# Patient Record
Sex: Female | Born: 1950 | Race: White | Hispanic: No | Marital: Married
Health system: Southern US, Community
[De-identification: ages and names within clinical notes are randomized; demographics above are authoritative.]

---

## 2007-01-17 DIAGNOSIS — D239 Other benign neoplasm of skin, unspecified: Secondary | ICD-10-CM

## 2007-01-17 HISTORY — DX: Other benign neoplasm of skin, unspecified: D23.9

## 2007-10-15 ENCOUNTER — Ambulatory Visit: Payer: Self-pay | Admitting: Otolaryngology

## 2008-11-12 ENCOUNTER — Emergency Department: Payer: Self-pay | Admitting: Emergency Medicine

## 2009-01-14 IMAGING — US US THYROID
1 series · 17 of 25 positions shown · non-contrast
Comparison: none

REASON FOR EXAM: Left thyroid nodule
COMMENTS:

[Series 1: us thyroid · 17 of 50 slices shown]
[im 1/50]
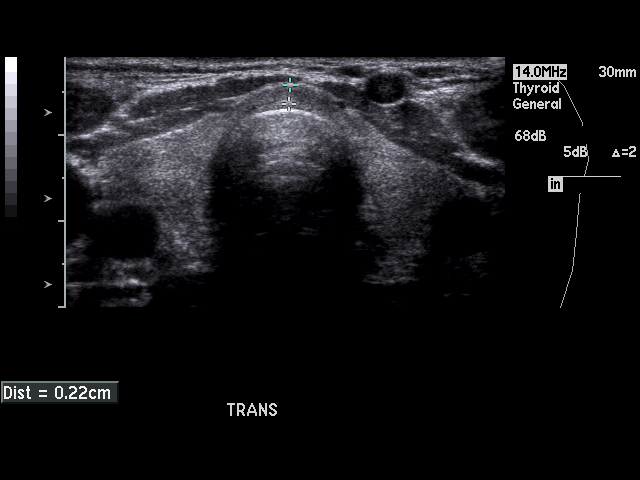
[im 5/50]
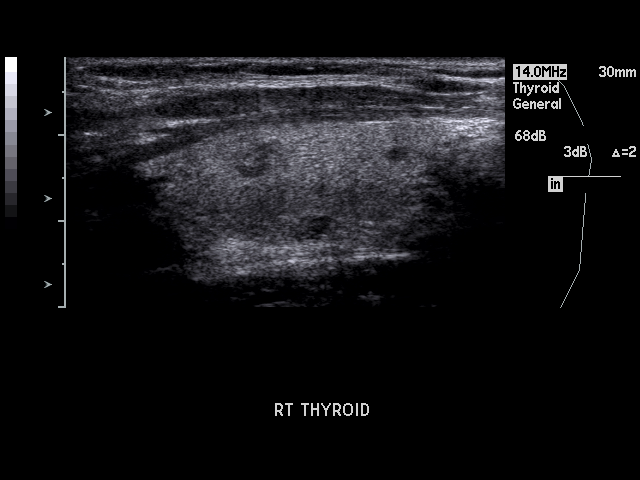
[im 7/50]
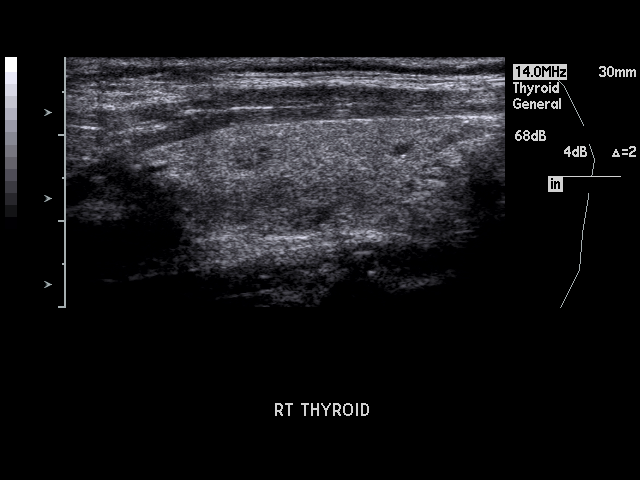
[im 11/50]
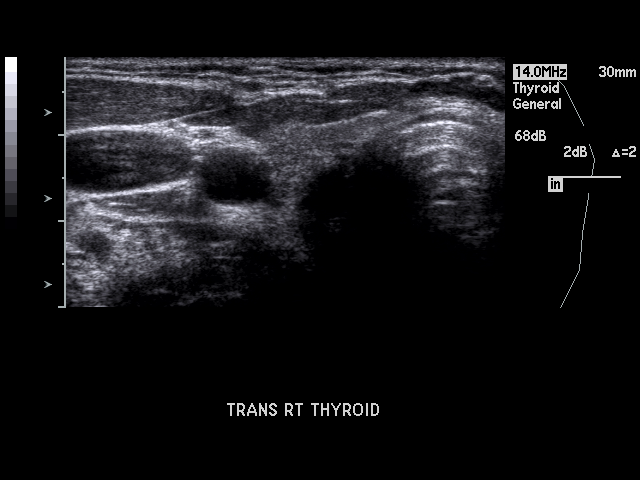
[im 13/50]
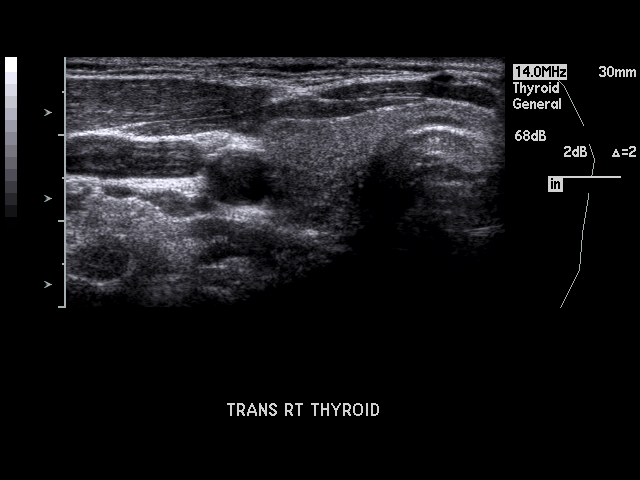
[im 17/50]
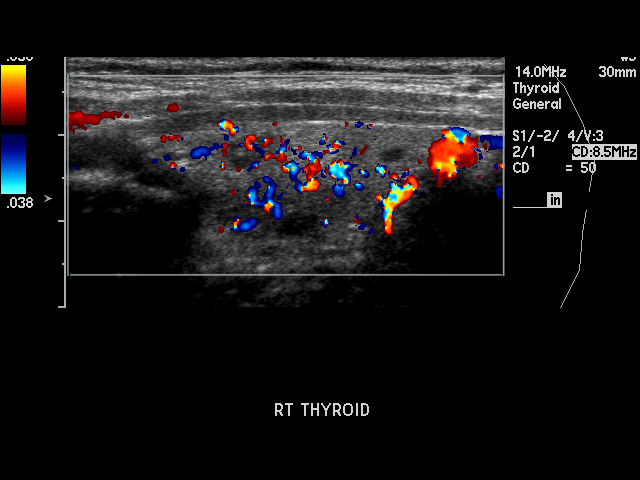
[im 19/50]
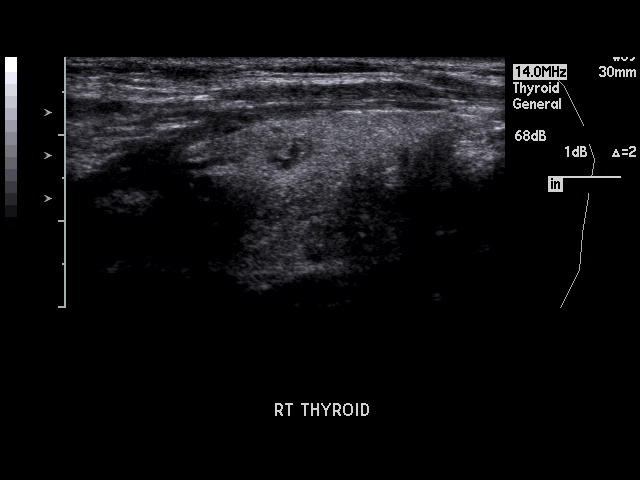
[im 23/50]
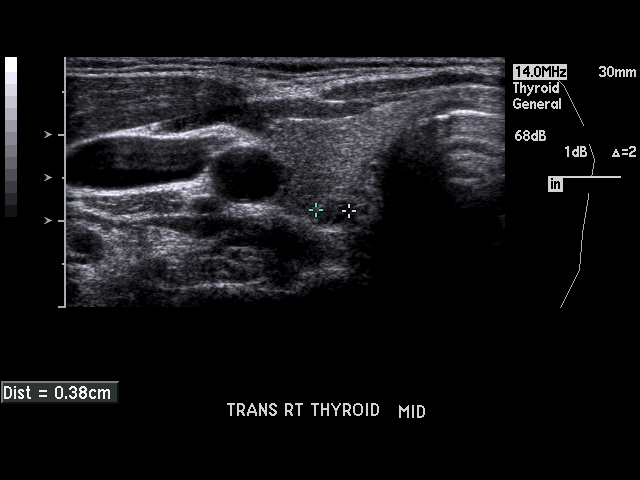
[im 25/50]
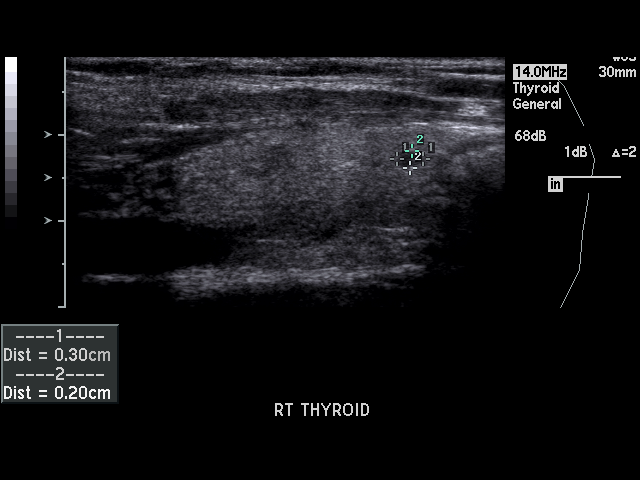
[im 27/50]
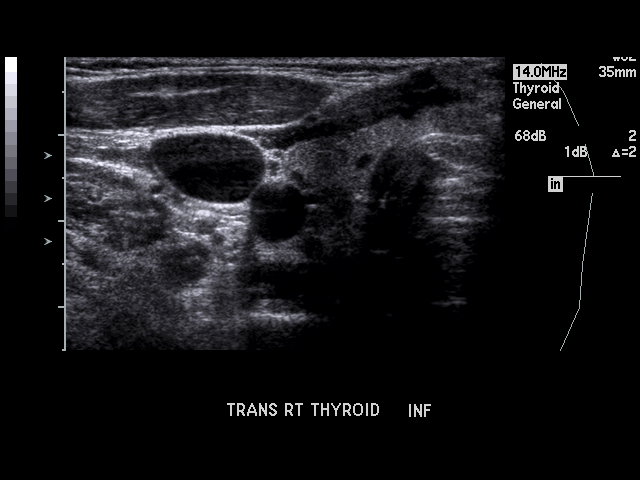
[im 31/50]
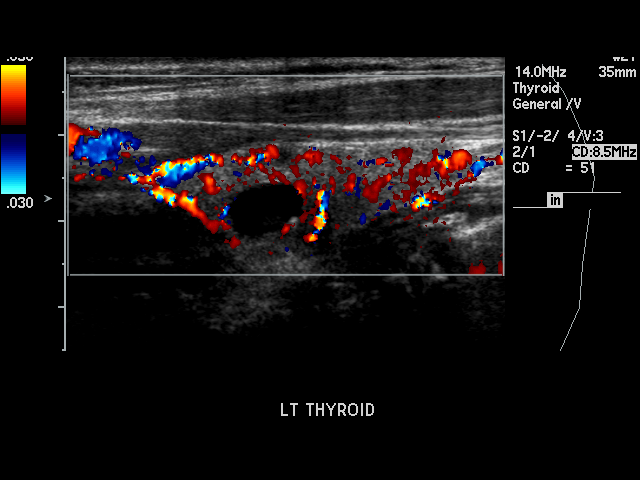
[im 33/50]
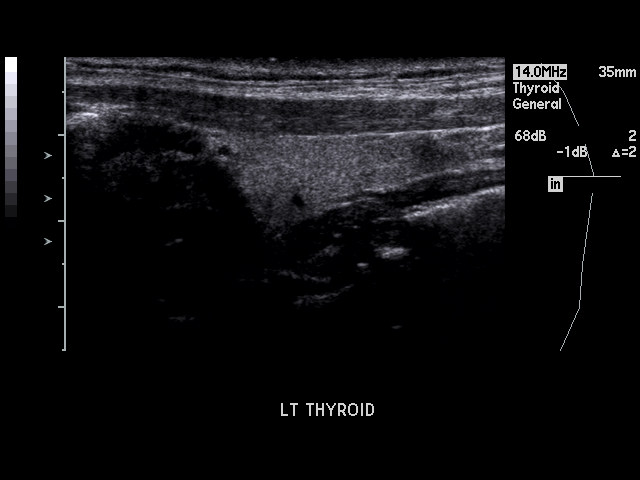
[im 37/50]
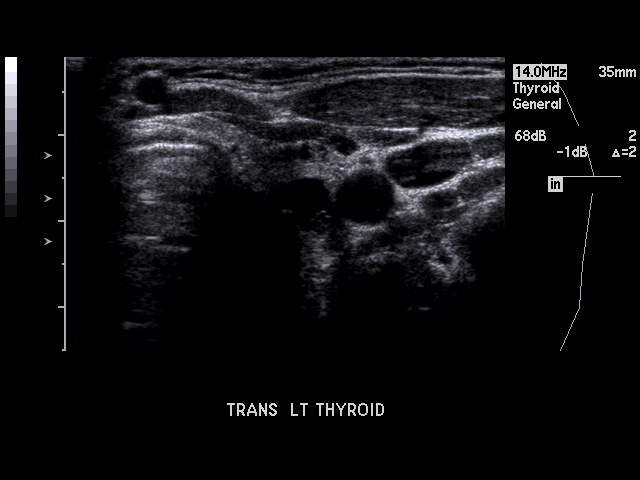
[im 39/50]
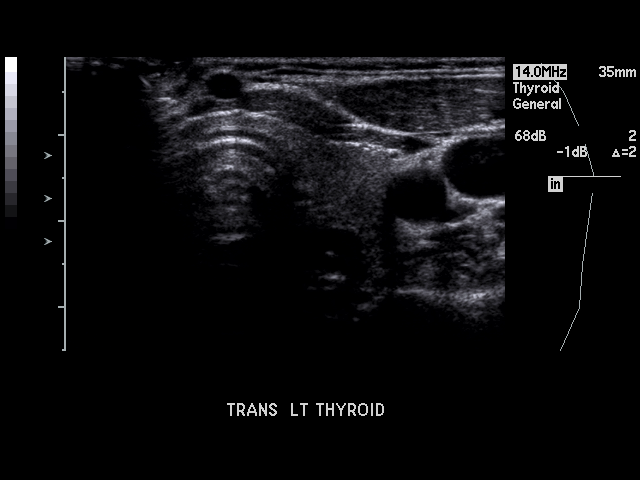
[im 43/50]
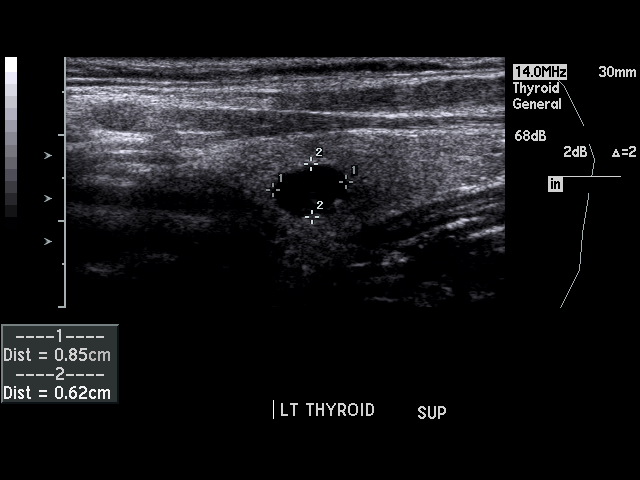
[im 45/50]
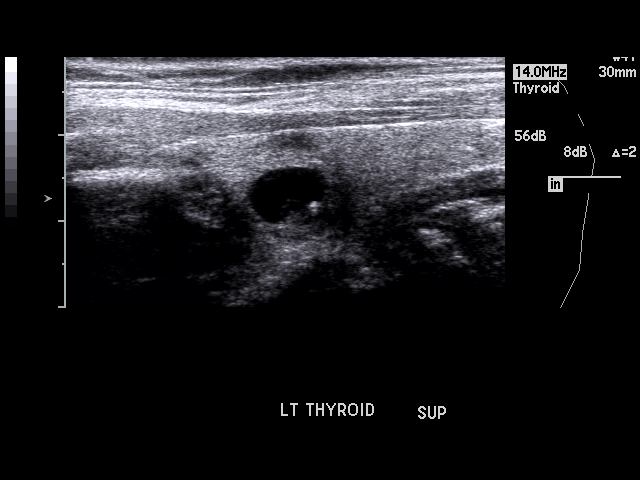
[im 50/50]
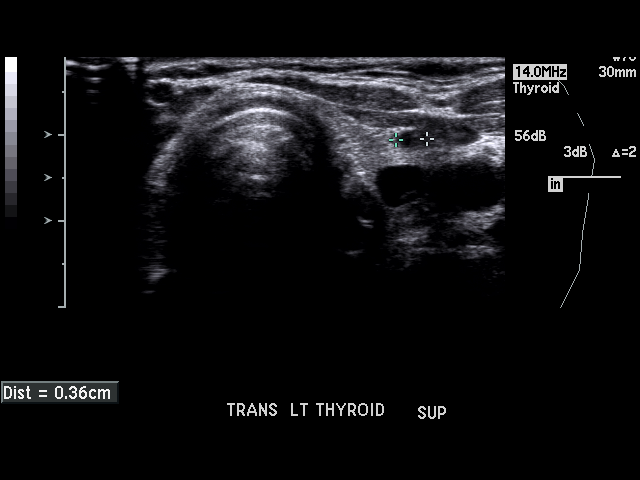

[17 of 25 positions shown; findings below may reference images not displayed]

PROCEDURE:     US  - US THYROID  - October 15, 2007  [DATE]

RESULT:     Thyroid Ultrasound reveals a normal size thyroid gland with the
RIGHT lobe measuring 3.7 cm and the LEFT lobe measuring 3.2 cm. The isthmus
measures 0.2 cm. A 6.0 mm, solid nodule is noted in the superior pole of the
RIGHT lobe of the thyroid. A 4.0 mm nodule is present in the mid portion of
the RIGHT lobe of the thyroid and a 3.0 mm nodule is noted inferiorly in the
RIGHT lobe of the thyroid. There is a 9.0 mm, complex cyst in the LEFT lobe
of the thyroid. There is a 5.0 mm nodule in the superior aspect of the LEFT
lobe of the thyroid. This nodule is solid.
IMPRESSION: 1.     Multinodular gland with multiple, 6.0 mm or less, solid nodules in
the RIGHT and LEFT lobes of the thyroid as described above.
2.     There is a 9.0 mm, complex cystic lesion in the LEFT lobe of the
thyroid. The lesion contains debris and the wall may be slightly thickened.
The wall may also contain subtle calcification. This is the nodule that is
to be aspirated/biopsied.

## 2010-02-12 IMAGING — CR RIGHT MIDDLE FINGER 2+V
1 series · 3 of 3 positions shown · non-contrast
Comparison: None

REASON FOR EXAM: caught in log splitter
COMMENTS:   LMP: Post-Menopausal

PROCEDURE:     DXR - DXR FINGER MID 3RD DIGIT RT HAND  - November 12, 2008  [DATE]
RESULT:     History: Trauma

[Series 1: view not recorded · 0.17mm/px · 3 of 3 slices shown]
[im 1/3]
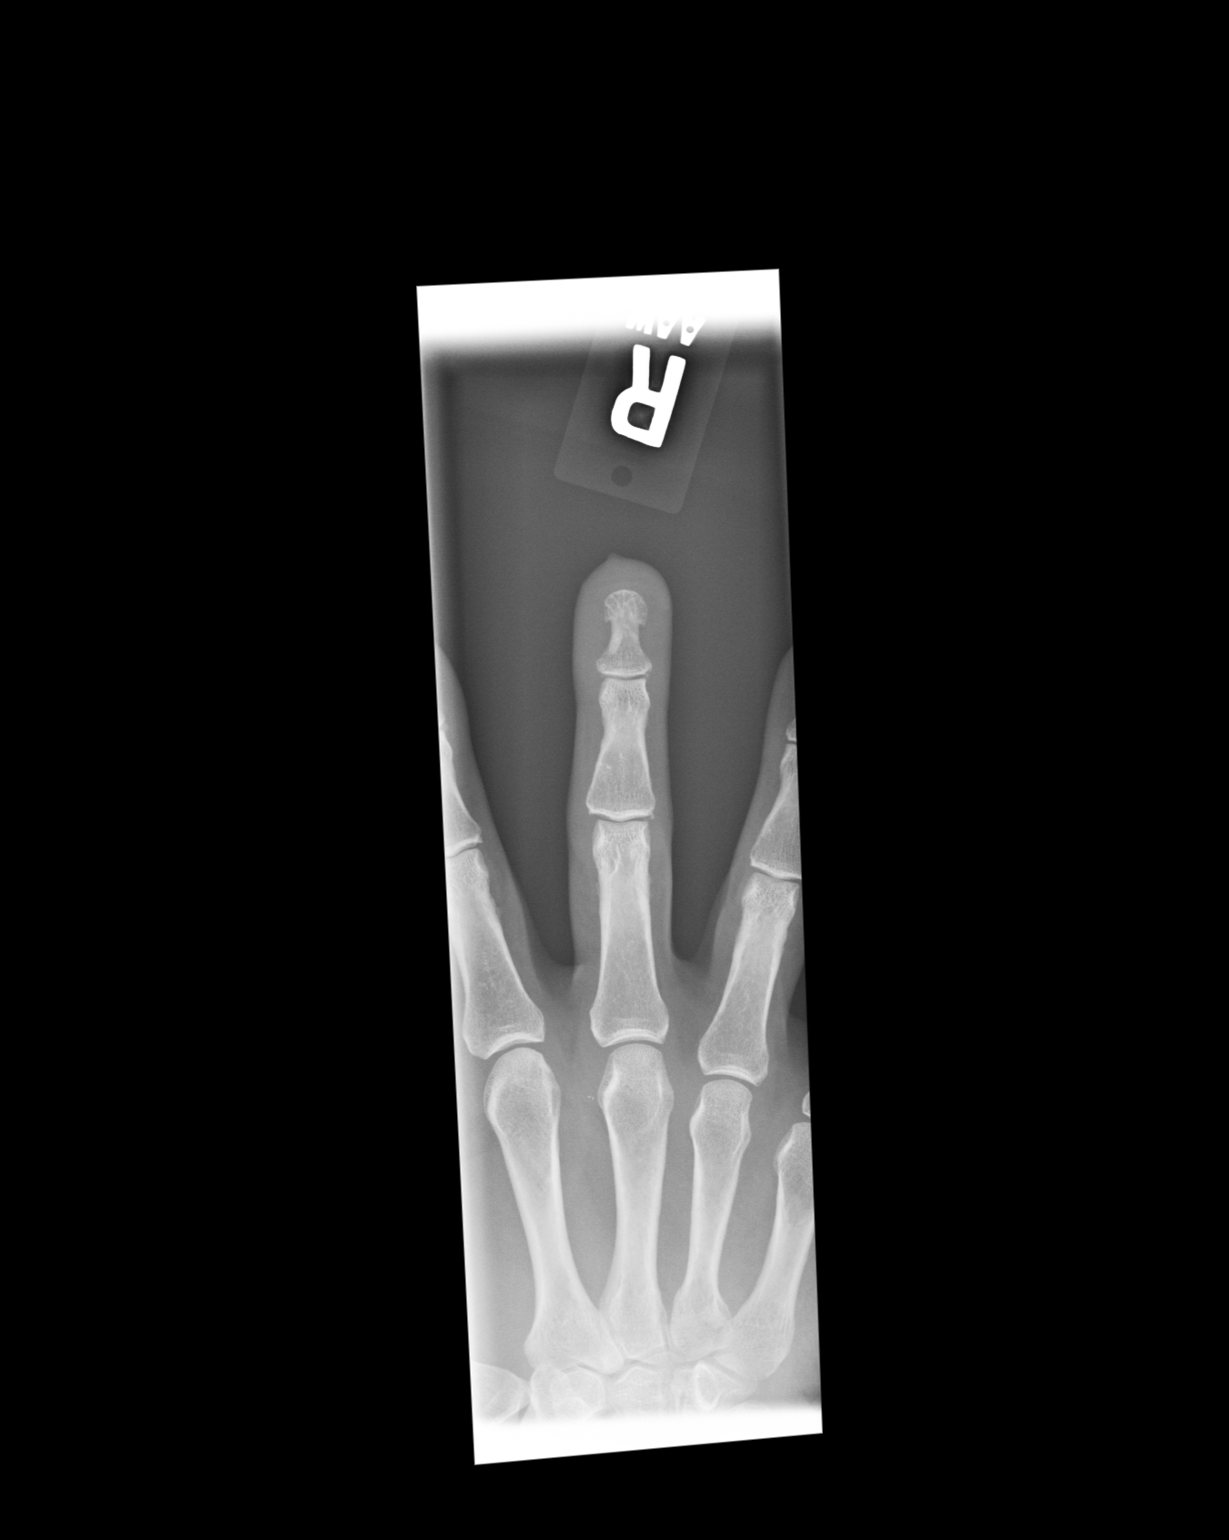
[im 2/3]
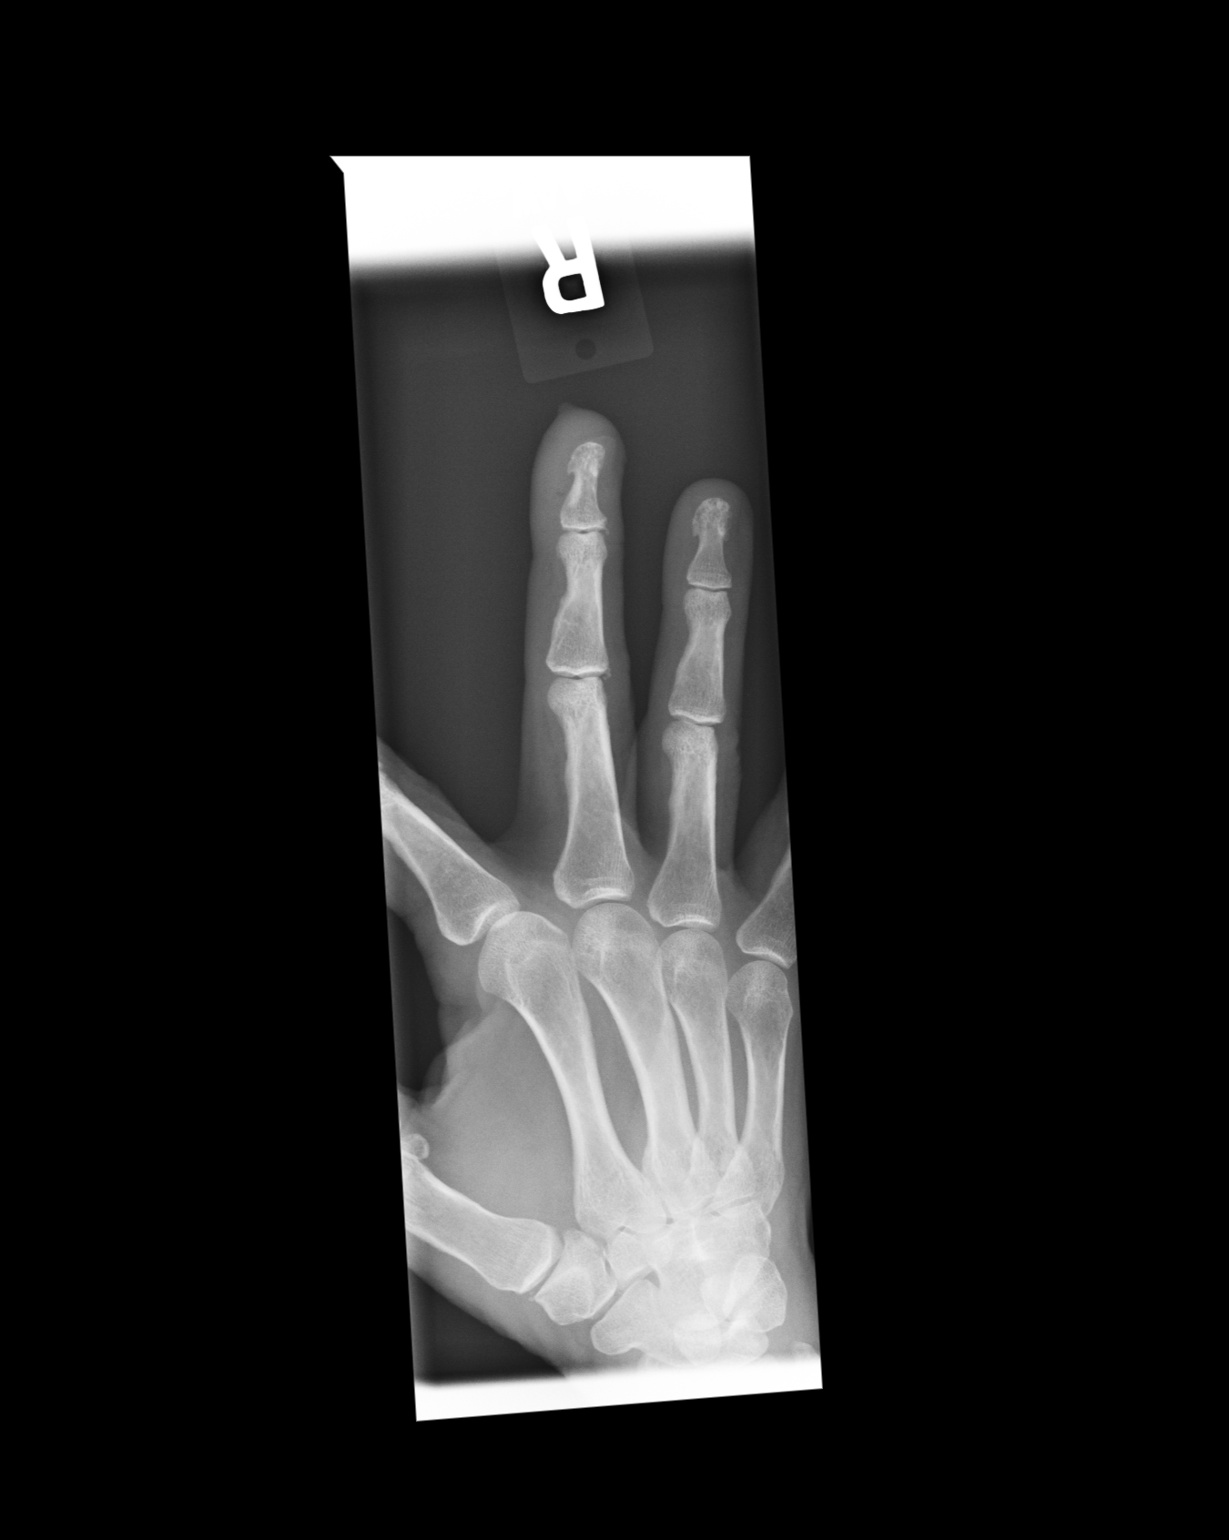
[im 3/3]
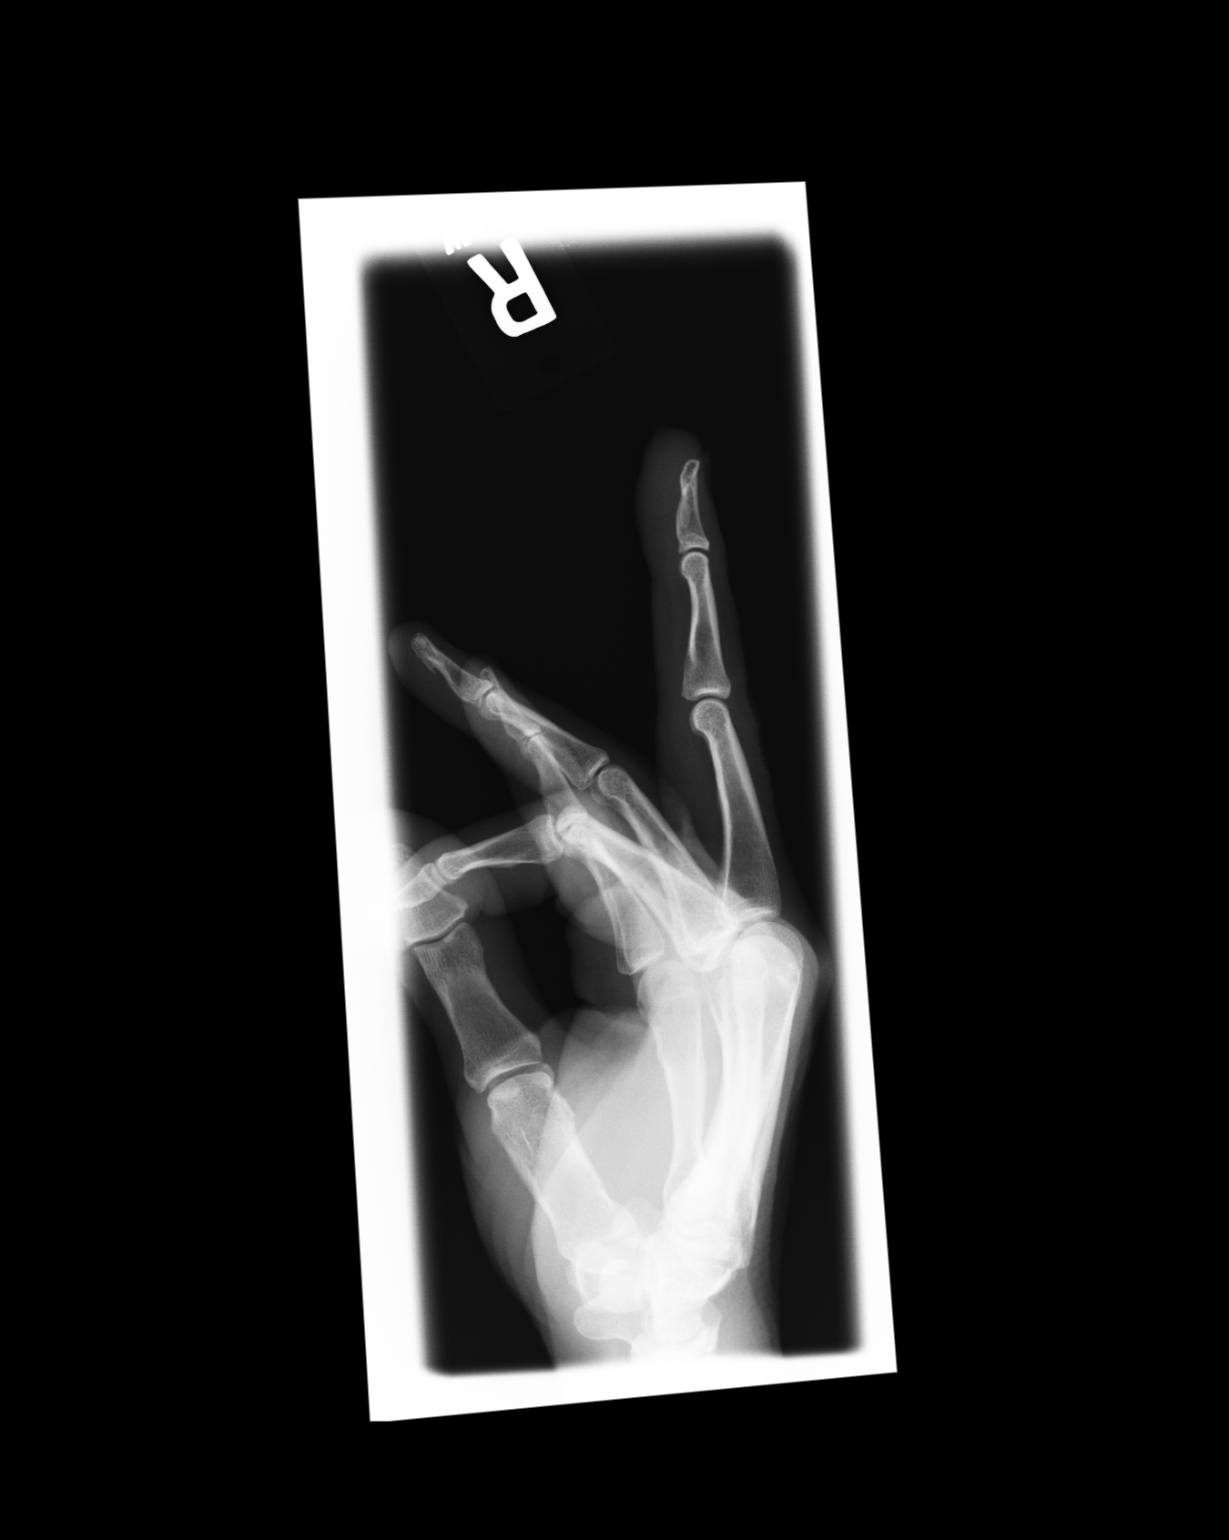

[3 of 3 positions shown; findings below may reference images not displayed]

FINDINGS: Three coned-down views of the left third digit demonstrates no fracture or
dislocation. There is subcutaneous emphysema in the soft tissues volar to
the distal third phalanx.
IMPRESSION: No acute osseous injury of the right third digit.

## 2022-03-13 ENCOUNTER — Ambulatory Visit: Payer: Medicare Other | Admitting: Dermatology

## 2022-03-13 DIAGNOSIS — D485 Neoplasm of uncertain behavior of skin: Secondary | ICD-10-CM

## 2022-03-13 DIAGNOSIS — D2271 Melanocytic nevi of right lower limb, including hip: Secondary | ICD-10-CM | POA: Diagnosis not present

## 2022-03-13 DIAGNOSIS — D229 Melanocytic nevi, unspecified: Secondary | ICD-10-CM

## 2022-03-13 DIAGNOSIS — D2262 Melanocytic nevi of left upper limb, including shoulder: Secondary | ICD-10-CM

## 2022-03-13 DIAGNOSIS — L578 Other skin changes due to chronic exposure to nonionizing radiation: Secondary | ICD-10-CM

## 2022-03-13 DIAGNOSIS — L821 Other seborrheic keratosis: Secondary | ICD-10-CM

## 2022-03-13 DIAGNOSIS — Z1283 Encounter for screening for malignant neoplasm of skin: Secondary | ICD-10-CM | POA: Diagnosis not present

## 2022-03-13 DIAGNOSIS — D1800 Hemangioma unspecified site: Secondary | ICD-10-CM

## 2022-03-13 DIAGNOSIS — L814 Other melanin hyperpigmentation: Secondary | ICD-10-CM

## 2022-03-13 NOTE — Patient Instructions (Addendum)
Wound Care Instructions  Cleanse wound gently with soap and water once a day then pat dry with clean gauze. Apply a thin coat of Petrolatum (petroleum jelly, "Vaseline") over the wound (unless you have an allergy to this). We recommend that you use a new, sterile tube of Vaseline. Do not pick or remove scabs. Do not remove the yellow or white "healing tissue" from the base of the wound.  Cover the wound with fresh, clean, nonstick gauze and secure with paper tape. You may use Band-Aids in place of gauze and tape if the wound is small enough, but would recommend trimming much of the tape off as there is often too much. Sometimes Band-Aids can irritate the skin.  You should call the office for your biopsy report after 1 week if you have not already been contacted.  If you experience any problems, such as abnormal amounts of bleeding, swelling, significant bruising, significant pain, or evidence of infection, please call the office immediately.  FOR ADULT SURGERY PATIENTS: If you need something for pain relief you may take 1 extra strength Tylenol (acetaminophen) AND 2 Ibuprofen (200mg each) together every 4 hours as needed for pain. (do not take these if you are allergic to them or if you have a reason you should not take them.) Typically, you may only need pain medication for 1 to 3 days.   Melanoma ABCDEs  Melanoma is the most dangerous type of skin cancer, and is the leading cause of death from skin disease.  You are more likely to develop melanoma if you: Have light-colored skin, light-colored eyes, or red or blond hair Spend a lot of time in the sun Tan regularly, either outdoors or in a tanning bed Have had blistering sunburns, especially during childhood Have a close family member who has had a melanoma Have atypical moles or large birthmarks  Early detection of melanoma is key since treatment is typically straightforward and cure rates are extremely high if we catch it early.   The  first sign of melanoma is often a change in a mole or a new dark spot.  The ABCDE system is a way of remembering the signs of melanoma.  A for asymmetry:  The two halves do not match. B for border:  The edges of the growth are irregular. C for color:  A mixture of colors are present instead of an even brown color. D for diameter:  Melanomas are usually (but not always) greater than 6mm - the size of a pencil eraser. E for evolution:  The spot keeps changing in size, shape, and color.  Please check your skin once per month between visits. You can use a small mirror in front and a large mirror behind you to keep an eye on the back side or your body.   If you see any new or changing lesions before your next follow-up, please call to schedule a visit.  Please continue daily skin protection including broad spectrum sunscreen SPF 30+ to sun-exposed areas, reapplying every 2 hours as needed when you're outdoors.    Due to recent changes in healthcare laws, you may see results of your pathology and/or laboratory studies on MyChart before the doctors have had a chance to review them. We understand that in some cases there may be results that are confusing or concerning to you. Please understand that not all results are received at the same time and often the doctors may need to interpret multiple results in order to provide you   with the best plan of care or course of treatment. Therefore, we ask that you please give us 2 business days to thoroughly review all your results before contacting the office for clarification. Should we see a critical lab result, you will be contacted sooner.   If You Need Anything After Your Visit  If you have any questions or concerns for your doctor, please call our main line at [phone] and press option 4 to reach your doctor's medical assistant. If no one answers, please leave a voicemail as directed and we will return your call as soon as possible. Messages left after 4  pm will be answered the following business day.   You may also send us a message via MyChart. We typically respond to MyChart messages within 1-2 business days.  For prescription refills, please ask your pharmacy to contact our office. Our fax number is [phone].  If you have an urgent issue when the clinic is closed that cannot wait until the next business day, you can page your doctor at the number below.    Please note that while we do our best to be available for urgent issues outside of office hours, we are not available 24/7.   If you have an urgent issue and are unable to reach us, you may choose to seek medical care at your doctor's office, retail clinic, urgent care center, or emergency room.  If you have a medical emergency, please immediately call 911 or go to the emergency department.  Pager Numbers  - Dr. Kowalski: [phone]  - Dr. Moye: [phone]  - Dr. Stewart: [phone]  In the event of inclement weather, please call our main line at [phone] for an update on the status of any delays or closures.  Dermatology Medication Tips: Please keep the boxes that topical medications come in in order to help keep track of the instructions about where and how to use these. Pharmacies typically print the medication instructions only on the boxes and not directly on the medication tubes.   If your medication is too expensive, please contact our office at [phone] option 4 or send us a message through MyChart.   We are unable to tell what your co-pay for medications will be in advance as this is different depending on your insurance coverage. However, we may be able to find a substitute medication at lower cost or fill out paperwork to get insurance to cover a needed medication.   If a prior authorization is required to get your medication covered by your insurance company, please allow us 1-2 business days to complete this process.  Drug prices often vary  depending on where the prescription is filled and some pharmacies may offer cheaper prices.  The website www.goodrx.com contains coupons for medications through different pharmacies. The prices here do not account for what the cost may be with help from insurance (it may be cheaper with your insurance), but the website can give you the price if you did not use any insurance.  - You can print the associated coupon and take it with your prescription to the pharmacy.  - You may also stop by our office during regular business hours and pick up a GoodRx coupon card.  - If you need your prescription sent electronically to a different pharmacy, notify our office through [facility] MyChart or by phone at [phone] option 4.     Si Usted Necesita Algo Despus de Su Visita  Tambin puede enviarnos un mensaje a travs   de MyChart. Por lo general respondemos a los mensajes de MyChart en el transcurso de 1 a 2 das hbiles.  Para renovar recetas, por favor pida a su farmacia que se ponga en contacto con nuestra oficina. Nuestro nmero de fax es el [phone].  Si tiene un asunto urgente cuando la clnica est cerrada y que no puede esperar hasta el siguiente da hbil, puede llamar/localizar a su doctor(a) al nmero que aparece a continuacin.   Por favor, tenga en cuenta que aunque hacemos todo lo posible para estar disponibles para asuntos urgentes fuera del horario de oficina, no estamos disponibles las 24 horas del da, los 7 das de la semana.   Si tiene un problema urgente y no puede comunicarse con nosotros, puede optar por buscar atencin mdica  en el consultorio de su doctor(a), en una clnica privada, en un centro de atencin urgente o en una sala de emergencias.  Si tiene una emergencia mdica, por favor llame inmediatamente al 911 o vaya a la sala de emergencias.  Nmeros de bper  - Dr. Kowalski: [phone]  - Dra. Moye: [phone]  - Dra. Stewart: [phone]  En caso de  inclemencias del tiempo, por favor llame a nuestra lnea principal al [phone] para una actualizacin sobre el estado de cualquier retraso o cierre.  Consejos para la medicacin en dermatologa: Por favor, guarde las cajas en las que vienen los medicamentos de uso tpico para ayudarle a seguir las instrucciones sobre dnde y cmo usarlos. Las farmacias generalmente imprimen las instrucciones del medicamento slo en las cajas y no directamente en los tubos del medicamento.   Si su medicamento es muy caro, por favor, pngase en contacto con nuestra oficina llamando al [phone] y presione la opcin 4 o envenos un mensaje a travs de MyChart.   No podemos decirle cul ser su copago por los medicamentos por adelantado ya que esto es diferente dependiendo de la cobertura de su seguro. Sin embargo, es posible que podamos encontrar un medicamento sustituto a menor costo o llenar un formulario para que el seguro cubra el medicamento que se considera necesario.   Si se requiere una autorizacin previa para que su compaa de seguros cubra su medicamento, por favor permtanos de 1 a 2 das hbiles para completar este proceso.  Los precios de los medicamentos varan con frecuencia dependiendo del lugar de dnde se surte la receta y alguna farmacias pueden ofrecer precios ms baratos.  El sitio web www.goodrx.com tiene cupones para medicamentos de diferentes farmacias. Los precios aqu no tienen en cuenta lo que podra costar con la ayuda del seguro (puede ser ms barato con su seguro), pero el sitio web puede darle el precio si no utiliz ningn seguro.  - Puede imprimir el cupn correspondiente y llevarlo con su receta a la farmacia.  - Tambin puede pasar por nuestra oficina durante el horario de atencin regular y recoger una tarjeta de cupones de GoodRx.  - Si necesita que su receta se enve electrnicamente a una farmacia diferente, informe a nuestra oficina a travs de MyChart de [place] o  por telfono llamando al [phone] y presione la opcin 4.  

## 2022-03-13 NOTE — Progress Notes (Signed)
New Patient Visit  Subjective  Dana Tran is a 71 y.o. female who presents for the following: Skin Problem (New patient here today to have some spots that have been itching at back checked. She has been using rx cortisone which has helped. Patient with a hx of dysplastic nevus. Patient advises she has had some spots treated in the past. ).   The following portions of the chart were reviewed this encounter and updated as appropriate:       Review of Systems:  No other skin or systemic complaints except as noted in HPI or Assessment and Plan.  Objective  Well appearing patient in no apparent distress; mood and affect are within normal limits.  A full examination was performed including scalp, head, eyes, ears, nose, lips, neck, chest, axillae, abdomen, back, buttocks, bilateral upper extremities, bilateral lower extremities, hands, feet, fingers, toes, fingernails, and toenails. All findings within normal limits unless otherwise noted below.  Spinal mid back 0.6 cm pink soft papule      left upper inner arm, right calf 4 x 3 mm medium brown macule at right calf 3 mm medium brown macule at left upper inner arm    Assessment & Plan  Neoplasm of uncertain behavior of skin Spinal mid back  Epidermal / dermal shaving  Lesion diameter (cm):  0.6 Informed consent: discussed and consent obtained   Patient was prepped and draped in usual sterile fashion: area prepped with alcohol. Anesthesia: the lesion was anesthetized in a standard fashion   Anesthetic:  1% lidocaine w/ epinephrine 1-100,000 buffered w/ 8.4% NaHCO3 Instrument used: flexible razor blade   Hemostasis achieved with: pressure, aluminum chloride and electrodesiccation   Outcome: patient tolerated procedure well   Post-procedure details: wound care instructions given   Post-procedure details comment:  Ointment and small bandage applied  Specimen 1 - Surgical pathology Differential Diagnosis: Irritated nevus  vs neurofibroma  Check Margins: No 0.6 cm pink soft papule    Nevus (2) left upper inner arm; right calf  Benign-appearing.  Observation.  Call clinic for new or changing lesions.  Recommend daily use of broad spectrum spf 30+ sunscreen to sun-exposed areas.    Lentigines - Scattered tan macules - Due to sun exposure - Benign-appearing, observe - Recommend daily broad spectrum sunscreen SPF 30+ to sun-exposed areas, reapply every 2 hours as needed. - Call for any changes  Seborrheic Keratoses - Stuck-on, waxy, tan-brown papules and/or plaques  - Benign-appearing - Discussed benign etiology and prognosis. - Observe - Call for any changes  Melanocytic Nevi - Tan-brown and/or pink-flesh-colored symmetric macules and papules - Benign appearing on exam today - Observation - Call clinic for new or changing moles - Recommend daily use of broad spectrum spf 30+ sunscreen to sun-exposed areas.   Hemangiomas - Red papules - Discussed benign nature - Observe - Call for any changes  Actinic Damage - Chronic condition, secondary to cumulative UV/sun exposure - diffuse scaly erythematous macules with underlying dyspigmentation - Recommend daily broad spectrum sunscreen SPF 30+ to sun-exposed areas, reapply every 2 hours as needed.  - Staying in the shade or wearing long sleeves, sun glasses (UVA+UVB protection) and wide brim hats (4-inch brim around the entire circumference of the hat) are also recommended for sun protection.  - Call for new or changing lesions.  Skin cancer screening performed today.  Return in about 1 year (around 03/14/2023) for TBSE.  Graciella Belton, RMA, am acting as scribe for Brendolyn Patty, MD .  Documentation: I have reviewed the above documentation for accuracy and completeness, and I agree with the above.  Brendolyn Patty MD

## 2022-03-19 ENCOUNTER — Telehealth: Payer: Self-pay

## 2022-03-19 NOTE — Telephone Encounter (Signed)
-----   Message from Brendolyn Patty, MD sent at 03/19/2022  9:31 AM EDT ----- Skin , spinal mid back NEUROFIBROMA, BASE INVOLVED  Benign - please call patient

## 2022-03-19 NOTE — Telephone Encounter (Signed)
Advised patient biopsy was benign and no further treatment needed.

## 2022-10-02 ENCOUNTER — Ambulatory Visit: Payer: Medicare Other | Admitting: Dermatology

## 2022-10-02 VITALS — BP 157/98 | HR 74

## 2022-10-02 DIAGNOSIS — L900 Lichen sclerosus et atrophicus: Secondary | ICD-10-CM

## 2022-10-02 DIAGNOSIS — D3617 Benign neoplasm of peripheral nerves and autonomic nervous system of trunk, unspecified: Secondary | ICD-10-CM

## 2022-10-02 DIAGNOSIS — D3610 Benign neoplasm of peripheral nerves and autonomic nervous system, unspecified: Secondary | ICD-10-CM

## 2022-10-02 DIAGNOSIS — H02403 Unspecified ptosis of bilateral eyelids: Secondary | ICD-10-CM | POA: Diagnosis not present

## 2022-10-02 MED ORDER — CLOBETASOL PROPIONATE 0.05 % EX CREA: TOPICAL_CREAM | CUTANEOUS | 0 refills | Status: DC

## 2022-10-02 NOTE — Progress Notes (Signed)
   Follow-Up Visit   Subjective  Dana Tran is a 72 y.o. female who presents for the following: Other (Having a issue with itching and burning in between vaginal and rectal area. patient has been dealing with since October 2022 prescribed a cortisone cream to use. Has been seen by several other doctors. Just in one area. ).  Uses HC 2.5% cream sparingly off and on, but doesn't seem to help.   The following portions of the chart were reviewed this encounter and updated as appropriate:      Review of Systems: No other skin or systemic complaints except as noted in HPI or Assessment and Plan.   Objective  Well appearing patient in no apparent distress; mood and affect are within normal limits.  A focused examination was performed including vaginal / perianal area, back, face. Relevant physical exam findings are noted in the Assessment and Plan.  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 Condition is bothersome/symptomatic for patient. Currently flared.   Lichen sclerosus is a chronic inflammatory condition of unknown cause that frequently involves the vaginal area and less commonly extragenital skin, and is NOT sexually transmitted. It frequently causes symptoms of pain and burning.  It requires regular monitoring and treatment with topical steroids to minimize inflammation and to reduce risk of scarring. There is also a risk of cancer in the vaginal area which is very low if inflammation is well controlled. Regular checks of the area are recommended. Please call if you notice any new or changing spots within this  area.  Start Clobetasol cream 0.05 % - apply thin layer topically to aa bid for 2 weeks. After 2 weeks use once daily to aa for itching/burning  Topical steroids (such as triamcinolone, fluocinolone, fluocinonide, mometasone, clobetasol, halobetasol, betamethasone, hydrocortisone) can cause thinning and lightening of the skin if they are used for too long in the same area. Your physician has selected the right strength medicine for your problem and area affected on the body. Please use your medication only as directed by your physician to prevent side effects.   Will recheck in 1 month  clobetasol cream (TEMOVATE) 0.05 % - vagina / perianal area Apply thin layer topically to aa bid for 2 weeks. After 2 weeks use once daily to aa for rash  Ptosis of both eyelids b/l eyes  Discussed blepharoplasty to treat  Recommend ocular eye surgeon Norlene Duel MD at [facility]  Or  Heron Nay MD. 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 Observe       Return for 1 month LS follow up.  I, Ruthell Rummage, CMA, am acting as scribe for Brendolyn Patty, MD.  Documentation: I have reviewed the above documentation for accuracy and completeness, and I agree with the above.  Brendolyn Patty MD

## 2022-10-02 NOTE — Patient Instructions (Addendum)
Recommend Dr. Norlene Duel at [facility] and [facility] For ocular plastic surgeon     Lichen sclerosus is a chronic inflammatory condition of unknown cause that frequently involves the vaginal area and less commonly extragenital skin, and is NOT sexually transmitted. It frequently causes symptoms of pain and burning.  It requires regular monitoring and treatment with topical steroids to minimize inflammation and to reduce risk of scarring. There is also a risk of cancer in the vaginal area which is very low if inflammation is well controlled. Regular checks of the area are recommended. Please call if you notice any new or changing spots within this area.  Start clobetasol cream - apply thin layer to affected areas of itchy rash twice daily for 2 weeks. If area is less itchy or inflamed can use once daily to affected areas at groin  Avoid applying to face and axilla. Use as directed. Long-term use can cause thinning of the skin.  Topical steroids (such as triamcinolone, fluocinolone, fluocinonide, mometasone, clobetasol, halobetasol, betamethasone, hydrocortisone) can cause thinning and lightening of the skin if they are used for too long in the same area. Your physician has selected the right strength medicine for your problem and area affected on the body. Please use your medication only as directed by your physician to prevent side effects.      Due to recent changes in healthcare laws, you may see results of your pathology and/or laboratory studies on MyChart before the doctors have had a chance to review them. We understand that in some cases there may be results that are confusing or concerning to you. Please understand that not all results are received at the same time and often the doctors may need to interpret multiple results in order to provide you with the best plan of care or course of treatment. Therefore, we ask that you please give [place] 2 business days to thoroughly review  all your results before contacting the office for clarification. Should we see a critical lab result, you will be contacted sooner.   If You Need Anything After Your Visit  If you have any questions or concerns for your doctor, please call our main line at [phone] and press option 4 to reach your doctor's medical assistant. If no one answers, please leave a voicemail as directed and we will return your call as soon as possible. Messages left after 4 pm will be answered the following business day.   You may also send [place] a message via [place]. We typically respond to MyChart messages within 1-2 business days.  For prescription refills, please ask your pharmacy to contact our office. Our fax number is [phone].  If you have an urgent issue when the clinic is closed that cannot wait until the next business day, you can page your doctor at the number below.    Please note that while we do our best to be available for urgent issues outside of office hours, we are not available 24/7.   If you have an urgent issue and are unable to reach [place], you may choose to seek medical care at your doctor's office, retail clinic, urgent care center, or emergency room.  If you have a medical emergency, please immediately call 911 or go to the emergency department.  Pager Numbers  - Dr. Nehemiah Massed: [phone]  - Dr. Laurence Ferrari: [phone]  - Dr. Nicole Kindred: [phone]  In the event of inclement weather, please call our main line at [phone] for an update  on the status of any delays or closures.  Dermatology Medication Tips: Please keep the boxes that topical medications come in in order to help keep track of the instructions about where and how to use these. Pharmacies typically print the medication instructions only on the boxes and not directly on the medication tubes.   If your medication is too expensive, please contact our office at [phone] option 4 or send [place] a message through [place].    We are unable to tell what your co-pay for medications will be in advance as this is different depending on your insurance coverage. However, we may be able to find a substitute medication at lower cost or fill out paperwork to get insurance to cover a needed medication.   If a prior authorization is required to get your medication covered by your insurance company, please allow [place] 1-2 business days to complete this process.  Drug prices often vary depending on where the prescription is filled and some pharmacies may offer cheaper prices.  The website www.goodrx.com contains coupons for medications through different pharmacies. The prices here do not account for what the cost may be with help from insurance (it may be cheaper with your insurance), but the website can give you the price if you did not use any insurance.  - You can print the associated coupon and take it with your prescription to the pharmacy.  - You may also stop by our office during regular business hours and pick up a GoodRx coupon card.  - If you need your prescription sent electronically to a different pharmacy, notify our office through [facility] or by phone at [phone] option 4.     Si Usted Necesita Algo Despus de Su Visita  Tambin puede enviarnos un mensaje a travs de Pharmacist, community. Por lo general respondemos a los mensajes de MyChart en el transcurso de 1 a 2 das hbiles.  Para renovar recetas, por favor pida a su farmacia que se ponga en contacto con nuestra oficina. Harland Dingwall de fax es [place] [phone].  Si tiene un asunto urgente cuando la clnica est cerrada y que no puede esperar hasta el siguiente da hbil, puede llamar/localizar a su doctor(a) al nmero que aparece a continuacin.   Por favor, tenga en cuenta que aunque hacemos todo lo posible para estar disponibles para asuntos urgentes fuera del horario de [place], no estamos disponibles las 24 horas del da, los 7 das de la [place].    Si tiene un problema urgente y no puede comunicarse con nosotros, puede optar por buscar atencin mdica  en el consultorio de su doctor(a), en una clnica privada, en un centro de atencin urgente o en una sala de emergencias.  Si tiene Engineering geologist, por favor llame inmediatamente al 911 o vaya a la sala de emergencias.  Nmeros de bper  - Dr. Nehemiah Massed: [phone]  - Dra. Moye: [phone]  - Dra. Nicole Kindred: [phone]  En caso de inclemencias del [place], por favor llame a Johnsie Kindred principal al [phone] para una actualizacin sobre el [place] de cualquier retraso o cierre.  Consejos para la medicacin en dermatologa: Por favor, guarde las cajas en las que vienen los medicamentos de uso tpico para ayudarle a seguir las instrucciones sobre dnde y cmo usarlos. Las farmacias generalmente imprimen las instrucciones del medicamento slo en las cajas y no directamente en los tubos del [place].   Si su medicamento es muy caro, por favor, pngase en contacto con nuestra oficina llamando al  [phone] y presione la opcin 4 o envenos un mensaje a travs de Pharmacist, community.   No podemos decirle cul ser su copago por los medicamentos por adelantado ya que esto es diferente dependiendo de la cobertura de su seguro. Sin embargo, es posible que podamos encontrar un medicamento sustituto a Electrical engineer un formulario para que el seguro cubra el medicamento que se considera necesario.   Si se requiere una autorizacin previa para que su compaa de seguros [place] su medicamento, por favor permtanos de 1 a 2 das hbiles para completar este proceso.  Los precios de los medicamentos varan con frecuencia dependiendo del Environmental consultant de dnde se surte la receta y alguna farmacias pueden ofrecer precios ms baratos.  El sitio web www.goodrx.com tiene cupones para medicamentos de Airline pilot. Los precios aqu no tienen en cuenta lo que podra costar con la ayuda del seguro  (puede ser ms barato con su seguro), pero el sitio web puede darle el precio si no utiliz Research scientist (physical sciences).  - Puede imprimir el cupn correspondiente y llevarlo con su receta a la farmacia.  - Tambin puede pasar por nuestra oficina durante el horario de atencin regular y Charity fundraiser una tarjeta de cupones de GoodRx.  - Si necesita que su receta se enve electrnicamente a una farmacia diferente, informe a nuestra oficina a travs de MyChart de [place] o por telfono llamando al [phone] y presione la opcin 4.

## 2022-10-03 MED ORDER — CLOBETASOL PROPIONATE 0.05 % EX OINT: TOPICAL_OINTMENT | CUTANEOUS | 0 refills | Status: DC

## 2022-10-03 NOTE — Addendum Note (Signed)
Addended by: Ruthell Rummage A on: 10/03/2022 07:56 AM   Modules accepted: Orders

## 2022-11-06 ENCOUNTER — Ambulatory Visit: Payer: Medicare Other | Admitting: Dermatology

## 2022-11-06 VITALS — BP 137/81 | HR 80

## 2022-11-06 DIAGNOSIS — H02843 Edema of right eye, unspecified eyelid: Secondary | ICD-10-CM | POA: Diagnosis not present

## 2022-11-06 DIAGNOSIS — L900 Lichen sclerosus et atrophicus: Secondary | ICD-10-CM | POA: Diagnosis not present

## 2022-11-06 DIAGNOSIS — L821 Other seborrheic keratosis: Secondary | ICD-10-CM

## 2022-11-06 DIAGNOSIS — L304 Erythema intertrigo: Secondary | ICD-10-CM | POA: Diagnosis not present

## 2022-11-06 DIAGNOSIS — L820 Inflamed seborrheic keratosis: Secondary | ICD-10-CM

## 2022-11-06 NOTE — Patient Instructions (Addendum)
Seborrheic Keratosis  What causes seborrheic keratoses? Seborrheic keratoses are harmless, common skin growths that first appear during adult life.  As time goes by, more growths appear.  Some people may develop a large number of them.  Seborrheic keratoses appear on both covered and uncovered body parts.  They are not caused by sunlight.  The tendency to develop seborrheic keratoses can be inherited.  They vary in color from skin-colored to gray, brown, or even black.  They can be either smooth or have a rough, warty surface.   Seborrheic keratoses are superficial and look as if they were stuck on the skin.  Under the microscope this type of keratosis looks like layers upon layers of skin.  That is why at times the top layer may seem to fall off, but the rest of the growth remains and re-grows.    Treatment Seborrheic keratoses do not need to be treated, but can easily be removed in the office.  Seborrheic keratoses often cause symptoms when they rub on clothing or jewelry.  Lesions can be in the way of shaving.  If they become inflamed, they can cause itching, soreness, or burning.  Removal of a seborrheic keratosis can be accomplished by freezing, burning, or surgery. If any spot bleeds, scabs, or grows rapidly, please return to have it checked, as these can be an indication of a skin cancer.  Cryotherapy Aftercare  Wash gently with soap and water everyday.   Apply Vaseline and Band-Aid daily until healed.      For areas under breasts   Recommend OTC Zeasorb AF powder to body folds daily after shower.  It is often found in the athlete's foot section in the pharmacy.  Avoid using powders that contain cornstarch.  Recommend OTC clotrimazole or miconazole cream for yeast infection of skin under breast  Intertrigo Intertrigo is skin irritation (inflammation) that happens in warm, moist areas of the body. The irritation can cause a rash and make skin raw and itchy. The rash is usually pink or  red. It happens mostly between folds of skin or where skin rubs together, such as: In the armpits. Under the breasts. Under the belly. In the groin area. Around the butt area. Between the toes. This condition is not passed from person to person. What are the causes? Heat, moisture, rubbing, and not enough air movement. The condition can be made worse by: Sweat. Bacteria. A fungus, such as yeast. What increases the risk? Moisture in your skin folds. You are more likely to develop this condition if you: Are not able to move around. Live in a warm and moist climate. Are not able to control your pee (urine) or poop (stool). Wear splints, braces, or other medical devices. Are overweight. Have diabetes. What are the signs or symptoms? A pink or red skin rash in a skin fold or near a skin fold. Raw or scaly skin. Itching. A burning feeling. Bleeding. Leaking fluid. A bad smell. How is this treated? Cleaning and drying your skin. Taking an antibiotic medicine or using an antibiotic skin cream for a bacterial infection. Using an antifungal cream on your skin or taking pills for an infection that was caused by a fungus, such as yeast. Using a steroid ointment to stop the itching and irritation. Separating the skin fold with a clean cotton cloth to absorb moisture and allow air to flow into the area. Follow these instructions at home: Keep the affected area clean and dry. Do not scratch your skin. Stay  cool as much as you can. Use an air conditioner or a fan, if you have one. Apply over-the-counter and prescription medicines only as told by your doctor. If you were prescribed antibiotics, use them as told by your doctor. Do not stop using the antibiotic even if you start to feel better. Keep all follow-up visits. Your doctor may need to check your skin to make sure that the treatment is working. How is this prevented? Shower and dry yourself well after being active. Use a hair dryer  on a cool setting to dry between skin folds. Do not wear tight clothes. Wear clothes that: Are loose. Take moisture away from your body. Are made of cotton. Wear a bra that gives good support, if needed. Protect the skin in your groin and butt area as told by your doctor. To do this: Follow a regular cleaning routine. Use creams, powders, or ointments that protect your skin. Change protection pads often. Stay at a healthy weight. Take care of your feet. This is very important if you have diabetes. You should: Wear shoes that fit well. Keep your feet dry. Wear clean cotton or wool socks. Keep your blood sugar under control if you have diabetes. Contact a doctor if: Your symptoms do not get better with treatment. Your symptoms get worse or they spread. You notice more redness and warmth. You have a fever. This information is not intended to replace advice given to you by your health care provider. Make sure you discuss any questions you have with your health care provider. Document Revised: 12/14/2021 Document Reviewed: 12/14/2021 Elsevier Patient Education  [place].      Lichen sclerosus is a chronic inflammatory condition of unknown cause that frequently involves the vaginal area and less commonly extragenital skin, and is NOT sexually transmitted. It frequently causes symptoms of pain and burning.  It requires regular monitoring and treatment with topical steroids to minimize inflammation and to reduce risk of scarring. There is also a risk of cancer in the vaginal area which is very low if inflammation is well controlled. Regular checks of the area are recommended. Please call if you notice any new or changing spots within this area.   Use 1 - 2 times weekly  Clobetasol ointment 0.05 % - apply thin layer topically to aa bid for 2 weeks. After 2 weeks use once daily to aa for itching/burning  Topical steroids (such as triamcinolone, fluocinolone, fluocinonide, mometasone,  clobetasol, halobetasol, betamethasone, hydrocortisone) can cause thinning and lightening of the skin if they are used for too long in the same area. Your physician has selected the right strength medicine for your problem and area affected on the body. Please use your medication only as directed by your physician to prevent side effects.      Due to recent changes in healthcare laws, you may see results of your pathology and/or laboratory studies on MyChart before the doctors have had a chance to review them. We understand that in some cases there may be results that are confusing or concerning to you. Please understand that not all results are received at the same time and often the doctors may need to interpret multiple results in order to provide you with the best plan of care or course of treatment. Therefore, we ask that you please give [place] 2 business days to thoroughly review all your results before contacting the office for clarification. Should we see a critical lab result, you will be contacted sooner.  If You Need Anything After Your Visit  If you have any questions or concerns for your doctor, please call our main line at [phone] and press option 4 to reach your doctor's medical assistant. If no one answers, please leave a voicemail as directed and we will return your call as soon as possible. Messages left after 4 pm will be answered the following business day.   You may also send [place] a message via [place]. We typically respond to MyChart messages within 1-2 business days.  For prescription refills, please ask your pharmacy to contact our office. Our fax number is [phone].  If you have an urgent issue when the clinic is closed that cannot wait until the next business day, you can page your doctor at the number below.    Please note that while we do our best to be available for urgent issues outside of office hours, we are not available 24/7.   If you have an urgent issue and  are unable to reach [place], you may choose to seek medical care at your doctor's office, retail clinic, urgent care center, or emergency room.  If you have a medical emergency, please immediately call 911 or go to the emergency department.  Pager Numbers  - Dr. Nehemiah Massed: [phone]  - Dr. Laurence Ferrari: [phone]  - Dr. Nicole Kindred: [phone]  In the event of inclement weather, please call our main line at [phone] for an update on the status of any delays or closures.  Dermatology Medication Tips: Please keep the boxes that topical medications come in in order to help keep track of the instructions about where and how to use these. Pharmacies typically print the medication instructions only on the boxes and not directly on the medication tubes.   If your medication is too expensive, please contact our office at [phone] option 4 or send [place] a message through [place].   We are unable to tell what your co-pay for medications will be in advance as this is different depending on your insurance coverage. However, we may be able to find a substitute medication at lower cost or fill out paperwork to get insurance to cover a needed medication.   If a prior authorization is required to get your medication covered by your insurance company, please allow [place] 1-2 business days to complete this process.  Drug prices often vary depending on where the prescription is filled and some pharmacies may offer cheaper prices.  The website www.goodrx.com contains coupons for medications through different pharmacies. The prices here do not account for what the cost may be with help from insurance (it may be cheaper with your insurance), but the website can give you the price if you did not use any insurance.  - You can print the associated coupon and take it with your prescription to the pharmacy.  - You may also stop by our office during regular business hours and pick up a GoodRx coupon card.  - If you need your  prescription sent electronically to a different pharmacy, notify our office through [facility] or by phone at [phone] option 4.     Si Usted Necesita Algo Despus de Su Visita  Tambin puede enviarnos un mensaje a travs de Pharmacist, community. Por lo general respondemos a los mensajes de MyChart en el transcurso de 1 a 2 das hbiles.  Para renovar recetas, por favor pida a su farmacia que se ponga en contacto con nuestra oficina. Harland Dingwall de fax es [place] [phone].  Si  tiene un asunto urgente cuando la clnica est cerrada y que no puede esperar hasta el siguiente da hbil, puede llamar/localizar a su doctor(a) al nmero que aparece a continuacin.   Por favor, tenga en cuenta que aunque hacemos todo lo posible para estar disponibles para asuntos urgentes fuera del horario de [place], no estamos disponibles las 24 horas del da, los 7 das de la [place].   Si tiene un problema urgente y no puede comunicarse con nosotros, puede optar por buscar atencin mdica  en el consultorio de su doctor(a), en una clnica privada, en un centro de atencin urgente o en una sala de emergencias.  Si tiene Engineering geologist, por favor llame inmediatamente al 911 o vaya a la sala de emergencias.  Nmeros de bper  - Dr. Nehemiah Massed: [phone]  - Dra. Moye: [phone]  - Dra. Nicole Kindred: [phone]  En caso de inclemencias del [place], por favor llame a Johnsie Kindred principal al [phone] para una actualizacin sobre el [place] de cualquier retraso o cierre.  Consejos para la medicacin en dermatologa: Por favor, guarde las cajas en las que vienen los medicamentos de uso tpico para ayudarle a seguir las instrucciones sobre dnde y cmo usarlos. Las farmacias generalmente imprimen las instrucciones del medicamento slo en las cajas y no directamente en los tubos del [place].   Si su medicamento es muy caro, por favor, pngase en contacto con Zigmund Daniel llamando al [phone]  y presione la opcin 4 o envenos un mensaje a travs de Pharmacist, community.   No podemos decirle cul ser su copago por los medicamentos por adelantado ya que esto es diferente dependiendo de la cobertura de su seguro. Sin embargo, es posible que podamos encontrar un medicamento sustituto a Electrical engineer un formulario para que el seguro cubra el medicamento que se considera necesario.   Si se requiere una autorizacin previa para que su compaa de seguros [place] su medicamento, por favor permtanos de 1 a 2 das hbiles para completar este proceso.  Los precios de los medicamentos varan con frecuencia dependiendo del Environmental consultant de dnde se surte la receta y alguna farmacias pueden ofrecer precios ms baratos.  El sitio web www.goodrx.com tiene cupones para medicamentos de Airline pilot. Los precios aqu no tienen en cuenta lo que podra costar con la ayuda del seguro (puede ser ms barato con su seguro), pero el sitio web puede darle el precio si no utiliz Research scientist (physical sciences).  - Puede imprimir el cupn correspondiente y llevarlo con su receta a la farmacia.  - Tambin puede pasar por nuestra oficina durante el horario de atencin regular y Charity fundraiser una tarjeta de cupones de GoodRx.  - Si necesita que su receta se enve electrnicamente a una farmacia diferente, informe a nuestra oficina a travs de MyChart de [place] o por telfono llamando al [phone] y presione la opcin 4.

## 2022-11-06 NOTE — Progress Notes (Signed)
Follow-Up Visit   Subjective  Dana Tran is a 72 y.o. 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 Reports symptoms have improved while using clobetasol cream. Didn't get ointment, but cream works fine. Patient also concerned with spot at right clavicle area, rash under breasts, and irritated spot under breasts.   The following portions of the chart were reviewed this encounter and updated as appropriate: medications, allergies, medical history  Review of Systems:  No other skin or systemic complaints except as noted in HPI or Assessment and Plan.  Objective  Well appearing patient in no apparent distress; mood and affect are within normal limits.   A focused examination was performed of the following areas: Vaginal and perianal area, right orbital area, right clavicle , b/l inframammary  Relevant exam findings are noted in the Assessment and Plan.    Assessment & Plan   INFLAMED SEBORRHEIC KERATOSIS Exam: Erythematous keratotic or waxy stuck-on papule  Symptomatic, irritating, patient would like treated.  Benign-appearing.  Call clinic for new or changing lesions.   Prior to procedure, discussed risks of blister formation, small wound, skin dyspigmentation, or rare scar following treatment. Recommend Vaseline ointment to treated areas while healing.  Destruction Procedure Note Destruction method: cryotherapy   Informed consent: discussed and consent obtained   Lesion destroyed using liquid nitrogen: Yes   Outcome: patient tolerated procedure well with no complications   Post-procedure details: wound care instructions given   Locations: right clavicle x 1 , left inframammary x 4 # of Lesions Treated: 5    Lichen sclerosus vagina / perianal area   Mild hypopigmentation and erythema at inferior vaginal introitus and mild erythema at BL labia majora  Chronic and persistent condition with duration  or expected duration over one year. Much improved with decreased symptoms but not currently at goal.     Lichen sclerosus is a chronic inflammatory condition of unknown cause that frequently involves the vaginal area and less commonly extragenital skin, and is NOT sexually transmitted. It frequently causes symptoms of pain and burning.  It requires regular monitoring and treatment with topical steroids to minimize inflammation and to reduce risk of scarring. There is also a risk of cancer in the vaginal area which is very low if inflammation is well controlled. Regular checks of the area are recommended. Please call if you notice any new or changing spots within this area.   Clobetasol ointment 0.05 % - Continue 1 or 2 times weekly as maintenance. For flares- apply thin layer topically to aa vaginal area bid for 2 weeks, then after 2 weeks use once daily to aa for itching/burning   Topical steroids (such as triamcinolone, fluocinolone, fluocinonide, mometasone, clobetasol, halobetasol, betamethasone, hydrocortisone) can cause thinning and lightening of the skin if they are used for too long in the same area. Your physician has selected the right strength medicine for your problem and area affected on the body. Please use your medication only as directed by your physician to prevent side effects.   Swelling  Exam: swelling at right periorbital area   Treatment Plan: Hx of angioedema treated with prednisone and antihistamines  Discussed seeing a allergy specialist or angioedema specialist in [place]. Pt will consider   SEBORRHEIC KERATOSIS - Stuck-on, waxy, tan-brown papules and/or plaques  - Benign-appearing - Discussed benign etiology and prognosis. - Observe - Call for any changes  INTERTRIGO Exam Mild erythema inframammary  Chronic and persistent  condition with duration or expected duration over one year. Condition is symptomatic/ bothersome to patient. Not currently at goal.    Intertrigo is a chronic recurrent rash that occurs in skin fold areas that may be associated with friction; heat; moisture; yeast; fungus; and bacteria.  It is exacerbated by increased movement / activity; sweating; and higher atmospheric temperature.  Treatment Plan Recommend OTC Zeasorb AF powder to body folds daily after shower.  It is often found in the athlete's foot section in the pharmacy.  Avoid using powders that contain cornstarch.  Recommend OTC clotrimazole or miconazole cream qd/bid for rash under breast   Return for keep follow up as scheduled .  I, Ruthell Rummage, CMA, am acting as scribe for Brendolyn Patty, MD.   Documentation: I have reviewed the above documentation for accuracy and completeness, and I agree with the above.  Brendolyn Patty, MD

## 2023-03-19 ENCOUNTER — Ambulatory Visit: Payer: Medicare Other | Admitting: Dermatology

## 2023-03-19 VITALS — BP 142/82 | HR 82

## 2023-03-19 DIAGNOSIS — D225 Melanocytic nevi of trunk: Secondary | ICD-10-CM | POA: Diagnosis not present

## 2023-03-19 DIAGNOSIS — R202 Paresthesia of skin: Secondary | ICD-10-CM

## 2023-03-19 DIAGNOSIS — L905 Scar conditions and fibrosis of skin: Secondary | ICD-10-CM | POA: Diagnosis not present

## 2023-03-19 DIAGNOSIS — S0096XA Insect bite (nonvenomous) of unspecified part of head, initial encounter: Secondary | ICD-10-CM

## 2023-03-19 DIAGNOSIS — L821 Other seborrheic keratosis: Secondary | ICD-10-CM

## 2023-03-19 DIAGNOSIS — S90562A Insect bite (nonvenomous), left ankle, initial encounter: Secondary | ICD-10-CM

## 2023-03-19 DIAGNOSIS — D485 Neoplasm of uncertain behavior of skin: Secondary | ICD-10-CM

## 2023-03-19 DIAGNOSIS — L900 Lichen sclerosus et atrophicus: Secondary | ICD-10-CM

## 2023-03-19 DIAGNOSIS — W908XXA Exposure to other nonionizing radiation, initial encounter: Secondary | ICD-10-CM

## 2023-03-19 DIAGNOSIS — D2262 Melanocytic nevi of left upper limb, including shoulder: Secondary | ICD-10-CM

## 2023-03-19 DIAGNOSIS — Z1283 Encounter for screening for malignant neoplasm of skin: Secondary | ICD-10-CM

## 2023-03-19 DIAGNOSIS — L578 Other skin changes due to chronic exposure to nonionizing radiation: Secondary | ICD-10-CM

## 2023-03-19 DIAGNOSIS — D2271 Melanocytic nevi of right lower limb, including hip: Secondary | ICD-10-CM

## 2023-03-19 DIAGNOSIS — S20419A Abrasion of unspecified back wall of thorax, initial encounter: Secondary | ICD-10-CM

## 2023-03-19 DIAGNOSIS — W57XXXA Bitten or stung by nonvenomous insect and other nonvenomous arthropods, initial encounter: Secondary | ICD-10-CM

## 2023-03-19 DIAGNOSIS — D229 Melanocytic nevi, unspecified: Secondary | ICD-10-CM

## 2023-03-19 DIAGNOSIS — S90561A Insect bite (nonvenomous), right ankle, initial encounter: Secondary | ICD-10-CM

## 2023-03-19 DIAGNOSIS — L814 Other melanin hyperpigmentation: Secondary | ICD-10-CM

## 2023-03-19 NOTE — Patient Instructions (Addendum)
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 Chronic rubbing or scratching causes darkening of the skin.  OTC treatments which can help with itch include numbing creams like pramoxine or lidocaine which temporarily reduce itch or Capsaicin-containing creams which cause a burning sensation but which sometimes over time will reset the nerves to stop producing itch.  If you choose to use Capsaicin cream, it is recommended to use it 5 times daily for 1 week followed by 3 times daily for 3-6 weeks. You may have to continue using it long-term.  If not doing well with OTC options, could consider Skin Medicinals compounded prescription anti-itch cream with Amitriptyline 5% / Lidocaine 5% / Pramoxine 1% or Amitriptyline 5% / Gabapentin 10% / Lidocaine 5% Cream or other prescription cream or pill options.    Wound Care Instructions  Cleanse wound gently with soap and water once a day then pat dry with clean gauze. Apply a thin coat of Petrolatum (petroleum jelly, "Vaseline") over the wound (unless you have an allergy to this). We recommend that you use a new, sterile tube of Vaseline. Do not pick or remove scabs. Do not remove the yellow or white "healing tissue" from the base of the wound.  Cover the wound with fresh, clean, nonstick gauze and secure with paper tape. You may use Band-Aids in place of gauze and tape if the wound is small enough, but would recommend trimming much of the tape off as there is often too much. Sometimes Band-Aids can irritate the skin.  You should call the office for your biopsy report after 1 week if you have not already been contacted.  If you experience any problems, such as abnormal amounts of bleeding, swelling, significant bruising, significant pain, or evidence of infection, please call the office immediately.  FOR ADULT SURGERY PATIENTS: If you need something for pain  relief you may take 1 extra strength Tylenol (acetaminophen) AND 2 Ibuprofen (200mg  each) together every 4 hours as needed for pain. (do not take these if you are allergic to them or if you have a reason you should not take them.) Typically, you may only need pain medication for 1 to 3 days.    Due to recent changes in healthcare laws, you may see results of your pathology and/or laboratory studies on MyChart before the doctors have had a chance to review them. We understand that in some cases there may be results that are confusing or concerning to you. Please understand that not all results are received at the same time and often the doctors may need to interpret multiple results in order to provide you with the best plan of care or course of treatment. Therefore, we ask that you please give [place] 2 business days to thoroughly review all your results before contacting the office for clarification. Should we see a critical lab result, you will be contacted sooner.   If You Need Anything After Your Visit  If you have any questions or concerns for your doctor, please call our main line at [phone] and press option 4 to reach your doctor's medical assistant. If no one answers, please leave a voicemail as directed and we will return your call as soon as possible. Messages left after 4 pm will be answered the following business day.   You may also send [place] a message via MyChart. We typically respond to MyChart messages within 1-2 business days.  For  prescription refills, please ask your pharmacy to contact our office. Our fax number is [phone].  If you have an urgent issue when the clinic is closed that cannot wait until the next business day, you can page your doctor at the number below.    Please note that while we do our best to be available for urgent issues outside of office hours, we are not available 24/7.   If you have an urgent issue and are unable to reach [place], you may choose to seek  medical care at your doctor's office, retail clinic, urgent care center, or emergency room.  If you have a medical emergency, please immediately call 911 or go to the emergency department.  Pager Numbers  - Dr. Gwen Pounds: [phone]  - Dr. Roseanne Reno: [phone]  - Dr. Katrinka Blazing: [phone]   In the event of inclement weather, please call our main line at [phone] for an update on the status of any delays or closures.  Dermatology Medication Tips: Please keep the boxes that topical medications come in in order to help keep track of the instructions about where and how to use these. Pharmacies typically print the medication instructions only on the boxes and not directly on the medication tubes.   If your medication is too expensive, please contact our office at [phone] option 4 or send [place] a message through MyChart.   We are unable to tell what your co-pay for medications will be in advance as this is different depending on your insurance coverage. However, we may be able to find a substitute medication at lower cost or fill out paperwork to get insurance to cover a needed medication.   If a prior authorization is required to get your medication covered by your insurance company, please allow [place] 1-2 business days to complete this process.  Drug prices often vary depending on where the prescription is filled and some pharmacies may offer cheaper prices.  The website www.goodrx.com contains coupons for medications through different pharmacies. The prices here do not account for what the cost may be with help from insurance (it may be cheaper with your insurance), but the website can give you the price if you did not use any insurance.  - You can print the associated coupon and take it with your prescription to the pharmacy.  - You may also stop by our office during regular business hours and pick up a GoodRx coupon card.  - If you need your prescription sent electronically to a  different pharmacy, notify our office through [facility] or by phone at [phone] option 4.     Si Usted Necesita Algo Despus de Su Visita  Tambin puede enviarnos un mensaje a travs de Clinical cytogeneticist. Por lo general respondemos a los mensajes de MyChart en el transcurso de 1 a 2 das hbiles.  Para renovar recetas, por favor pida a su farmacia que se ponga en contacto con nuestra oficina. Annie Sable de fax es [place] [phone].  Si tiene un asunto urgente cuando la clnica est cerrada y que no puede esperar hasta el siguiente da hbil, puede llamar/localizar a su doctor(a) al nmero que aparece a continuacin.   Por favor, tenga en cuenta que aunque hacemos todo lo posible para estar disponibles para asuntos urgentes fuera del horario de [place], no estamos disponibles las 24 horas del da, los 7 [address]  [address] de la [place].   Si tiene un problema urgente y no puede comunicarse con nosotros, puede optar por buscar atencin mdica  en  el consultorio de su doctor(a), en una clnica privada, en un centro de atencin urgente o en una sala de emergencias.  Si tiene Engineer, drilling, por favor llame inmediatamente al 911 o vaya a la sala de emergencias.  Nmeros de bper  - Dr. Gwen Pounds: [phone]  - Dra. Roseanne Reno: [phone]  - Dr. Katrinka Blazing: [phone]   En caso de inclemencias del tiempo, por favor llame a Lacy Duverney principal al [phone] para una actualizacin sobre el [place] de cualquier retraso o cierre.  Consejos para la medicacin en dermatologa: Por favor, guarde las cajas en las que vienen los medicamentos de uso tpico para ayudarle a seguir las instrucciones sobre dnde y cmo usarlos. Las farmacias generalmente imprimen las instrucciones del medicamento slo en las cajas y no directamente en los tubos del [place].   Si su medicamento es muy caro, por favor, pngase en contacto con Rolm Gala llamando al [phone] y presione la opcin 4 o envenos un  mensaje a travs de Clinical cytogeneticist.   No podemos decirle cul ser su copago por los medicamentos por adelantado ya que esto es diferente dependiendo de la cobertura de su seguro. Sin embargo, es posible que podamos encontrar un medicamento sustituto a Audiological scientist un formulario para que el seguro cubra el medicamento que se considera necesario.   Si se requiere una autorizacin previa para que su compaa de seguros [place] su medicamento, por favor permtanos de 1 a 2 das hbiles para completar [address]  Los precios de los medicamentos varan con frecuencia dependiendo del Environmental consultant de dnde se surte la receta y alguna farmacias pueden ofrecer precios ms baratos.  El sitio web www.goodrx.com tiene cupones para medicamentos de Health and safety inspector. Los precios aqu no tienen en cuenta lo que podra costar con la ayuda del seguro (puede ser ms barato con su seguro), pero el sitio web puede darle el precio si no utiliz Tourist information centre manager.  - Puede imprimir el cupn correspondiente y llevarlo con su receta a la farmacia.  - Tambin puede pasar por nuestra oficina durante el horario de atencin regular y Education officer, museum una tarjeta de cupones de GoodRx.  - Si necesita que su receta se enve electrnicamente a una farmacia diferente, informe a nuestra oficina a travs de MyChart de [place] o por telfono llamando al [phone] y presione la opcin 4.

## 2023-03-19 NOTE — Progress Notes (Signed)
Follow-Up Visit   Subjective  Dana Tran is a 72 y.o. female who presents for the following: Skin Cancer Screening and Full Body Skin Exam  The patient presents for Total-Body Skin Exam (TBSE) for skin cancer screening and mole check. The patient has spots, moles and lesions to be evaluated, some may be new or changing and the patient may have concern these could be cancer.  She has persistent itchy area on mid back.  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    The following portions of the chart were reviewed this encounter and updated as appropriate: medications, allergies, medical history  Review of Systems:  No other skin or systemic complaints except as noted in HPI or Assessment and Plan.  Objective  Well appearing patient in no apparent distress; mood and affect are within normal limits.  A full examination was performed including scalp, head, eyes, ears, nose, lips, neck, chest, axillae, abdomen, back, buttocks, bilateral upper extremities, bilateral lower extremities, hands, feet, fingers, toes, fingernails, and toenails. All findings within normal limits unless otherwise noted below.   Relevant physical exam findings are noted in the Assessment and Plan.  face, ankles Edematous pink papules on the face; excoriated pink papules on the ankles.  spinal mid upper back sup 4.0 mm flesh papule      spinal mid upper back inf 4.0 mm flesh papule    Assessment & Plan   SKIN CANCER SCREENING PERFORMED TODAY.  ACTINIC DAMAGE - Chronic condition, secondary to cumulative UV/sun exposure - diffuse scaly erythematous macules with underlying dyspigmentation - Recommend daily broad spectrum sunscreen SPF 30+ to sun-exposed areas, reapply every 2 hours as needed.  - Staying in the shade or wearing long sleeves, sun glasses (UVA+UVB protection) and wide brim hats (4-inch brim around the entire circumference of the hat) are also  recommended for sun protection.  - Call for new or changing lesions.  LENTIGINES, SEBORRHEIC KERATOSES, HEMANGIOMAS - Benign normal skin lesions - Benign-appearing - Call for any changes  MELANOCYTIC NEVI - Tan-brown and/or pink-flesh-colored symmetric macules and papules - 4 x 3 mm medium brown macule at right calf - 3 mm medium light brown macule at left upper inner arm - Benign appearing on exam today, Stable - Observation - Call clinic for new or changing moles - Recommend daily use of broad spectrum spf 30+ sunscreen to sun-exposed areas.   LICHEN SCLEROSUS ET ATROPHICUS Exam: Not examined today. Stable per patient.  Chronic condition with duration or expected duration over one year. Currently well-controlled.  Pt uses clobetasol ointment as needed (several times since last visit).  Lichen sclerosus is a chronic inflammatory condition of unknown cause that frequently involves the vaginal area and less commonly extragenital skin, and is NOT sexually transmitted. It frequently causes symptoms of pain and burning.  It requires regular monitoring and treatment with topical steroids to minimize inflammation and to reduce risk of scarring. There is also a risk of cancer in the vaginal area which is very low if inflammation is well controlled. Regular checks of the area are recommended. Please call if you notice any new or changing spots within this area.  Treatment Plan: Clobetasol ointment 0.05 % - Continue 1 or 2 times weekly as maintenance. For flares- apply thin layer topically to aa vaginal area bid for 2 weeks, then after 2 weeks use once daily to aa for itching/burning.   Topical steroids (such as triamcinolone, fluocinolone, fluocinonide, mometasone, clobetasol,  halobetasol, betamethasone, hydrocortisone) can cause thinning and lightening of the skin if they are used for too long in the same area. Your physician has selected the right strength medicine for your problem and area  affected on the body. Please use your medication only as directed by your physician to prevent side effects.   EXCORIATION Exam: Excoriations at upper back, healing  Treatment Plan: Recommend vaseline. Call if not resolving.   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 Condition is symptomatic/ bothersome to patient. Not currently at goal.   Treatment Plan:  Chronic condition without cure secondary to pinched nerve along spine causing itching or sensation changes in an area of skin. Chronic rubbing or scratching causes darkening of the skin.  OTC treatments which can help with itch include numbing creams like pramoxine or lidocaine which temporarily reduce itch or Capsaicin-containing creams which cause a burning sensation but which sometimes over time will reset the nerves to stop producing itch.  If you choose to use Capsaicin cream, it is recommended to use it 5 times daily for 1 week followed by 3 times daily for 3-6 weeks. You may have to continue using it long-term.  If not doing well with OTC options, could consider Skin Medicinals compounded prescription anti-itch cream with Amitriptyline 5% / Lidocaine 5% / Pramoxine 1% or Amitriptyline 5% / Gabapentin 10% / Lidocaine 5% Cream or other prescription cream or pill options.   Bug bite without infection, initial encounter face, ankles  Benign  Continue 2.5% hydrocortisone cream - spot treat until improved. Pt has at home.    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 Anesthesia: the lesion was anesthetized in a standard fashion   Anesthetic:  1% lidocaine w/ epinephrine  1-100,000 buffered w/ 8.4% NaHCO3 Instrument used: flexible razor blade   Hemostasis achieved with: pressure, aluminum chloride and electrodesiccation   Outcome: patient tolerated procedure well   Post-procedure details: wound care instructions given   Post-procedure details comment:  Ointment and small bandage applied  Specimen 1 - Surgical pathology Differential Diagnosis: r/o irritated neurofibroma vs irritated nevus  Check Margins: yes  spinal mid upper back inf  Epidermal / dermal shaving  Lesion diameter (cm):  0.6 Informed consent: discussed and consent obtained   Patient was prepped and draped in usual sterile fashion: Area prepped with alcohol. Anesthesia: the lesion was anesthetized in a standard fashion   Anesthetic:  1% lidocaine w/ epinephrine 1-100,000 buffered w/ 8.4% NaHCO3 Instrument used: flexible razor blade   Hemostasis achieved with: pressure, aluminum chloride and electrodesiccation   Outcome: patient tolerated procedure well   Post-procedure details: wound care instructions given   Post-procedure details comment:  Ointment and small bandage applied  Specimen 2 - Surgical pathology Differential Diagnosis: r/o irritated neurofibroma vs irritated nevus  Check Margins: yes  Symptomatic, itchy, patient would like removed.    Return in about 1 year (around 03/18/2024) for TBSE, Hx Dysplastic Nevus.  ICherlyn Labella, CMA, am acting as scribe for Willeen Niece, MD .   Documentation: I have reviewed the above documentation for accuracy and completeness, and I agree with the above.  Willeen Niece, MD

## 2023-03-26 ENCOUNTER — Telehealth: Payer: Self-pay

## 2023-03-26 NOTE — Telephone Encounter (Signed)
Left message for patient to call back for biopsy results.

## 2023-03-26 NOTE — Telephone Encounter (Signed)
Discussed biopsy results with patient 

## 2023-03-26 NOTE — Telephone Encounter (Signed)
-----   Message from [facility] sent at 03/26/2023 11:08 AM EDT ----- Diagnosis: 1. 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 Skin , spinal mid upper back inf MELANOCYTIC NEVUS WITH SCAR, BASE INVOLVED, SEE DESCRIPTION  Please call to share benign nevus diagnosis for both sites. One had scar. No further treatment needed. Nevi will likely regrow (present in base)  Plan: observation

## 2024-03-17 NOTE — Progress Notes (Addendum)
 MEDICARE WELLNESS VISIT   PROVIDERS RENDERING CARE Dr. Lenon, [facility] Gastroenterology  FUNCTIONAL ASSESSMENT  (1) Hearing: Demonstrates normal hearing in conversation.  (2) Risk of Falls: No reports of falls or abnormal balance. Gait is observed to be good upon observation.  (3) Home Safety; Home is safe and secure (4) Activities of Daily Living; Household chores and grooming are managed without problems. Personal finances are managed without problems.   DEPRESSION SCREENING There does not seem to be loss of interest in activities nor excess crying or changes in sleep or appetite.   COGNITIVE SCREENING Orientation is appropriate as are responses to questions and general conversation. No reports of forgetfulness or losing things.    PREVENTION PLAN Cardiovascular: Yearly cholesterol and active lifestyle   Diabetes: Yearly glucose  Mammogram: Declines to do because of discomfort Bone Density: 2017 normal  Colon Cancer: 11-16 with 1 small polyp and 1-23 with polyps Glaucoma: Yearly eye exam is needed Pneumonia: Pneumovax 11-19 and prevnar 11-20  Shingles: Shingrix 11-18 Influenza: Vaccine each fall Smoking Cessation: NA   OTHER PERSONALIZED HEALTH ADVISE Goals     . Exercise (x goals) (pt-stated)       Staying active and healthy vegetable based diet.   END OF LIFE CARE WANTS Full Code       [name] MD      [facility] 2/9 last 3 flowsheet values     02/27/2022 03/05/2023 03/14/2024  PHQ-9 Depression Screening   Little interest or pleasure in doing things  0 0  Feeling down, depressed, or hopeless  0 0  (OBSOLETE) Little interest or pleasure in doing things 2    (OBSOLETE) Feeling down, depressed, or hopeless (or irritable for Teens only)? 2    (OBSOLETE) Trouble falling or staying asleep, or sleeping too much? 3    (OBSOLETE) Feeling tired or having little energy? 3    (OBSOLETE) Poor appetite or overeating? 0    (OBSOLETE) Feeling bad about yourself - or that  you are a failure or have let yourself or your family down? 2    (OBSOLETE) Trouble concentrating on things, such as reading the newspaper or watching television? 0    (OBSOLETE) Moving or speaking so slowly that other people could have noticed?  Or the opposite - being so fidgety or restless that you have been moving around a lot more than usual? 0    (OBSOLETE) Thoughts that you would be better off dead, or of hurting yourself in some way? 1    (OBSOLETE) How difficult have these problems made it for you do your work, take care of things at home, or get along with people? Somewhat difficult    (OBSOLETE) Total Score = 13        Depression Severity and Treatment Recommendations:  0-4= None  5-9= Mild / Treatment: Support, educate to call if worse; return in one month  10-14= Moderate / Treatment: Support, watchful waiting; Antidepressant or Psychotherapy  15-19= Moderately severe / Treatment: Antidepressant OR Psychotherapy  >= 20 = Major depression, severe / Antidepressant AND Psychotherapy  Please note approximately 15 minutes was spent and depression screening by me and nursing staff.     We discussed an exercise program and its benefits as well as a healthy diet including healthy fats and vegetables. These are very important parts of healthy living and cardiovascular health. 15 minutes was devoted to this.      Dana Tran is a 73 y.o. female here for her annual exam  with health maintenance    Chief Complaint  Annual exam   HISTORY OF PRESENT ILLNESS  Hypothyroidism Tsh and energy are doing well   HTN, goal below 140/90 Taking medications without noted side effects or dizziness.    Prediabetes Glucose is followed and controlled on diet    Review Of Systems Constitutional; No weight loss, fever, chills, weakness  HEENT: No visual loss, blurred vision, hearing loss, ear pain, runny nose or sore throat SKIN; No rash or itching CARDIOVASCULAR; No chest  pain, pressure. No palpitations or edema RESPIRATORY; No shortness of breath, cough or sputum GASTROINTESTINAL; No nausea, vomiting, diarrhea or dysphagia GENITOURINARY; No dysuria, new incontinence, suprapubic pain NEUROLOGICAL; No headache, dizziness, syncopy, tingling MUSCULOSKELETAL; No muscle or joint pain or injuries HEMATOLOGICAL; No anemia, bleeding or abnormal bruising noted PSYCHIATRIC; No manic symptoms or severely blue mood ENDOCRINE; No sweating, temperature intolerance or polyuria, polydipsia   Patient Active Problem List  Diagnosis  . Angioedema  . Hypothyroidism  . Health care maintenance  . HTN, goal below 140/90  . Prediabetes    Past Medical History:  Diagnosis Date  . Allergic rhinitis   . Allergy   . Angioedema   . Angioedema March 15, 2014  . Arthritis    slightly in left hand  . Fatigue   . Hypertension   . Hypothyroidism   . Menopause   . Thyroid  disease   . Tubular adenoma of colon 06/15/2015    Past Surgical History:  Procedure Laterality Date  . COLONOSCOPY  06/15/2015   Tubular adenoma of colon/Repeat 3yrs/MGR  . COLONOSCOPY  08/16/2021   Tubular adenoma/Hyperplastic polyp/PHx CP/Repeat 57yrs/SMR  . CESAREAN SECTION  1979 & 1981    Family History  Problem Relation Name Age of Onset  . Other Mother Margretta Pomfret   . Asthma Mother Margretta Pomfret        Deceased 05/24/2017  . High blood pressure (Hypertension) Mother Margretta Pomfret        Deceased 05-24-2017  . Thyroid  disease Mother Margretta Pomfret        Deceased 2017-05-24  . Thyroid  cancer Brother    . Alcohol abuse Father Willma Pomfret        Deceased 1963-11-24  . Alcohol abuse Brother Rutha MICAEL Pomfret   . High blood pressure (Hypertension) Brother Rutha MICAEL Pomfret   . Thyroid  disease Brother Madeleine Pomfret        (Had thyroid  cancer in his early 20's & had thyroid  removed)--Deceased 03-16-2015  . Alcohol abuse Brother [place]   . High blood pressure (Hypertension) Brother  [place]   . Colon cancer Neg Hx    . Colon polyps Neg Hx    . Inflammatory bowel disease Neg Hx      Allergies  Allergen Reactions  . Wellbutrin [Bupropion Hcl] Rash  . Amlodipine Other (See Comments)    Possible increased angioedema  . Ibuprofen Swelling  . Nsaids (Non-Steroidal Anti-Inflammatory Drug) Angioedema     Social History   Socioeconomic History  . Marital status: Married  Tobacco Use  . Smoking status: Never  . Smokeless tobacco: Never  Vaping Use  . Vaping status: Never Used  Substance and Sexual Activity  . Alcohol use: Yes    Alcohol/week: 0.0 standard drinks of alcohol    Comment: Occasional  . Drug use: Never  . Sexual activity: Yes    Partners: Male    Birth control/protection: Post-menopausal   Social Drivers of Corporate investment banker Strain: Low Risk  (  03/14/2024)   Overall Financial Resource Strain (CARDIA)   . Difficulty of Paying Living Expenses: Not hard at all  Food Insecurity: No Food Insecurity (03/14/2024)   Hunger Vital Sign   . Worried About Programme researcher, broadcasting/film/video in the Last Year: Never true   . Ran Out of Food in the Last Year: Never true  Transportation Needs: No Transportation Needs (03/14/2024)   PRAPARE - Transportation   . Lack of Transportation (Medical): No   . Lack of Transportation (Non-Medical): No  Housing Stability: Unknown (03/17/2024)   Housing Stability Vital Sign   . Unable to Pay for Housing in the Last Year: No   . Homeless in the Last Year: No      Current Outpatient Medications:  .  cholecalciferol (VITAMIN D3) 2,000 unit capsule, Take 2,000 Units by mouth once daily., Disp: , Rfl:  .  clobetasoL  (TEMOVATE ) 0.05 % cream, APPLY THIN LAYER TOPICALLY TO AFFECTED AREA TWICE DAILY FOR 14 DAYS AFTER 2 WEEKS USE ONCE DAILY TO AFFECTED AREA FOR RASH, Disp: , Rfl:  .  cyanocobalamin (VITAMIN B12) 500 MCG tablet, Take 500 mcg by mouth once daily, Disp: , Rfl:  .  gabapentin (NEURONTIN) 100 MG capsule, Take 1 capsule  by mouth 2 (two) times daily, Disp: , Rfl:  .  GLUCOSAM SUL NA/CHONDR SU A NA (GLUCOSAMINE & CHONDROIT SUL.NA ORAL), Take 2 tablets by mouth once daily as needed., Disp: , Rfl:  .  hydrocortisone 2.5 % cream, Apply topically 2 (two) times daily, Disp: 84 g, Rfl: 3 .  Lactobacillus acidophilus Cap, Take by mouth., Disp: , Rfl:  .  levothyroxine (SYNTHROID) 75 MCG tablet, TAKE 1 TABLET BY MOUTH DAILY ON  AN EMPTY STOMACH WITH A FULL  GLASS OF WATER AT LEAST 1/2 HOUR TO 1 HOUR BEFORE BREAKFAST, Disp: 100 tablet, Rfl: 2 .  predniSONE (DELTASONE) 20 MG tablet, 3 daily prn angioedema, Disp: 270 tablet, Rfl: 0 .  temazepam (RESTORIL) 15 mg capsule, Take 1 capsule (15 mg total) by mouth nightly as needed for Sleep, Disp: 90 capsule, Rfl: 1 .  traMADoL (ULTRAM) 50 mg tablet, Take 1 tablet by mouth every 6 (six) hours as needed, Disp: , Rfl:  .  TURMERIC ORAL, Take 500 mg by mouth 2 (two) times daily, Disp: , Rfl:  .  ZINC ORAL, Take 50 mg by mouth once daily, Disp: , Rfl:   Vitals:   03/17/24 1051  BP: 133/82  Pulse: 76  Resp: 16    Body mass index is 26.98 kg/m. No acute distress, pleasant  HEENT: Normocephalic and Atraumatic, Oropharynx is clear, Tympanic membranes clear, conjunctiva have normal color NECK: No bruits, thyromegalia or adenopathy is noted CHEST; No distress, normal to inspection, clear to auscultation CARDIOVASCULAR; Regular rate and rhythm, no murmurs rubs or gallops appreciated. Peripheral pulses were palpated and present.  ABDOMEN; Soft and flat and nontender with bowel sounds appreciated in the normal range EXTREMITIES; No clubbing cyanosis or edema NEUROLOGICAL; Alert and responsive with good insight. Motor function and sensation are intact. Reflexes are present SKIN; No suspicious lesions are noted.    Appointment on 03/04/2024  Component Date Value Ref Range Status  . Glucose 03/04/2024 103  70 - 110 mg/dL Final  . Sodium 92/69/7974 140  136 - 145 mmol/L Final  .  Potassium 03/04/2024 4.2  3.6 - 5.1 mmol/L Final  . Chloride 03/04/2024 105  97 - 109 mmol/L Final  . Carbon Dioxide (CO2) 03/04/2024 27.8  22.0 -  32.0 mmol/L Final  . Calcium 03/04/2024 9.5  8.7 - 10.3 mg/dL Final  . Urea Nitrogen (BUN) 03/04/2024 23  7 - 25 mg/dL Final  . Creatinine 92/69/7974 0.8  0.6 - 1.1 mg/dL Final  . Glomerular Filtration Rate (eGFR) 03/04/2024 79  >60 mL/min/1.73sq m Final  . BUN/Crea Ratio 03/04/2024 28.8 (H)  6.0 - 20.0 Final  . Anion Gap w/K 03/04/2024 11.4  6.0 - 16.0 Final  . Hemoglobin A1C 03/04/2024 6.0 (H)  4.2 - 5.6 % Final  . Average Blood Glucose (Calc) 03/04/2024 126  mg/dL Final  . Protein, Total 03/04/2024 6.7  6.1 - 7.9 g/dL Final  . Albumin 92/69/7974 4.4  3.5 - 4.8 g/dL Final  . Bilirubin, Total 03/04/2024 0.7  0.3 - 1.2 mg/dL Final  . Bilirubin, Conjugated 03/04/2024 0.17  0.00 - 0.20 mg/dL Final  . Alk Phos (alkaline Phosphatase) 03/04/2024 76  34 - 104 U/L Final  . AST  03/04/2024 16  8 - 39 U/L Final  . ALT  03/04/2024 18  5 - 38 U/L Final  . Cholesterol, Total 03/04/2024 195  100 - 200 mg/dL Final  . Triglyceride 92/69/7974 105  35 - 199 mg/dL Final  . HDL (High Density Lipoprotein) Cho* 03/04/2024 58.6  35.0 - 85.0 mg/dL Final  . LDL Calculated 03/04/2024 884  0 - 130 mg/dL Final  . VLDL Cholesterol 03/04/2024 21  mg/dL Final  . Cholesterol/HDL Ratio 03/04/2024 3.3   Final  . Thyroid  Stimulating Hormone (TSH) 03/04/2024 1.591  0.450-5.330 uIU/ml uIU/mL Final  . Color 03/04/2024 Light Yellow  Colorless, Straw, Light Yellow, Yellow, Dark Yellow Final  . Clarity 03/04/2024 Clear  Clear Final  . Specific Gravity 03/04/2024 1.024  1.005 - 1.030 Final  . pH, Urine 03/04/2024 5.5  5.0 - 8.0 Final  . Protein, Urinalysis 03/04/2024 Negative  Negative mg/dL Final  . Glucose, Urinalysis 03/04/2024 Negative  Negative mg/dL Final  . Ketones, Urinalysis 03/04/2024 Negative  Negative mg/dL Final  . Blood, Urinalysis 03/04/2024 Negative  Negative Final   . Nitrite, Urinalysis 03/04/2024 Positive (!)  Negative Final  . Leukocyte Esterase, Urinalysis 03/04/2024 Negative  Negative Final  . Bilirubin, Urinalysis 03/04/2024 Negative  Negative Final  . Urobilinogen, Urinalysis 03/04/2024 0.2  0.2 - 1.0 mg/dL Final  . WBC, UA 92/69/7974 2  <=5 /hpf Final  . Red Blood Cells, Urinalysis 03/04/2024 1  <=3 /hpf Final  . Bacteria, Urinalysis 03/04/2024 0-5  0 - 5 /hpf Final  . Squamous Epithelial Cells, Urinaly* 03/04/2024 0  /hpf Final    ASSESSMENT  AND PLAN:  Diagnoses and all orders for this visit:  Routine general medical examination at a health care facility  Depression screening (Z13.31) -     Depression Screen -(PHQ- 2/9, BDI)  Health care maintenance  Acquired hypothyroidism Assessment & Plan: Tsh and energy are doing well   Orders: -     Basic Metabolic Panel (BMP); Future -     Hemoglobin A1C; Future -     Hepatic Function Panel (HFP); Future -     Lipid Panel w/calc LDL; Future -     Thyroid  Stimulating-Hormone (TSH); Future  HTN, goal below 140/90 Assessment & Plan: Taking medications without noted side effects or dizziness.    Orders: -     Basic Metabolic Panel (BMP); Future -     Hemoglobin A1C; Future -     Hepatic Function Panel (HFP); Future -     Lipid Panel w/calc LDL; Future -  Thyroid  Stimulating-Hormone (TSH); Future  Prediabetes Assessment & Plan: Glucose is followed and controlled on diet   Orders: -     Basic Metabolic Panel (BMP); Future -     Hemoglobin A1C; Future -     Hepatic Function Panel (HFP); Future -     Lipid Panel w/calc LDL; Future -     Thyroid  Stimulating-Hormone (TSH); Future  Throat irritation -     Ambulatory Referral to Otolaryngology      Goals     . * Exercise (x goals) (pt-stated)           *Some images could not be shown.

## 2024-04-14 ENCOUNTER — Ambulatory Visit: Payer: Medicare Other | Admitting: Dermatology

## 2024-04-14 DIAGNOSIS — L900 Lichen sclerosus et atrophicus: Secondary | ICD-10-CM

## 2024-04-14 DIAGNOSIS — D1724 Benign lipomatous neoplasm of skin and subcutaneous tissue of left leg: Secondary | ICD-10-CM

## 2024-04-14 DIAGNOSIS — D1801 Hemangioma of skin and subcutaneous tissue: Secondary | ICD-10-CM

## 2024-04-14 DIAGNOSIS — L821 Other seborrheic keratosis: Secondary | ICD-10-CM

## 2024-04-14 DIAGNOSIS — L578 Other skin changes due to chronic exposure to nonionizing radiation: Secondary | ICD-10-CM

## 2024-04-14 DIAGNOSIS — L720 Epidermal cyst: Secondary | ICD-10-CM | POA: Diagnosis not present

## 2024-04-14 DIAGNOSIS — R238 Other skin changes: Secondary | ICD-10-CM | POA: Diagnosis not present

## 2024-04-14 DIAGNOSIS — W908XXA Exposure to other nonionizing radiation, initial encounter: Secondary | ICD-10-CM

## 2024-04-14 DIAGNOSIS — D2262 Melanocytic nevi of left upper limb, including shoulder: Secondary | ICD-10-CM

## 2024-04-14 DIAGNOSIS — L814 Other melanin hyperpigmentation: Secondary | ICD-10-CM

## 2024-04-14 DIAGNOSIS — Z1283 Encounter for screening for malignant neoplasm of skin: Secondary | ICD-10-CM | POA: Diagnosis not present

## 2024-04-14 DIAGNOSIS — D229 Melanocytic nevi, unspecified: Secondary | ICD-10-CM

## 2024-04-14 DIAGNOSIS — Z86018 Personal history of other benign neoplasm: Secondary | ICD-10-CM

## 2024-04-14 DIAGNOSIS — D2271 Melanocytic nevi of right lower limb, including hip: Secondary | ICD-10-CM

## 2024-04-14 DIAGNOSIS — S50862A Insect bite (nonvenomous) of left forearm, initial encounter: Secondary | ICD-10-CM

## 2024-04-14 DIAGNOSIS — L820 Inflamed seborrheic keratosis: Secondary | ICD-10-CM | POA: Diagnosis not present

## 2024-04-14 MED ORDER — CLOBETASOL PROPIONATE 0.05 % EX OINT: TOPICAL_OINTMENT | CUTANEOUS | 2 refills | Status: DC

## 2024-04-14 NOTE — Patient Instructions (Addendum)
 Milia - tiny firm white papule right forehead - type of cyst - benign - sometimes these will clear with nightly OTC adapalene/Differin 0.1% gel or retinol. - may be extracted if symptomatic - observe  Cryotherapy Aftercare  Wash gently with soap and water everyday.   Apply Vaseline and Band-Aid daily until healed.   Due to recent changes in healthcare laws, you may see results of your pathology and/or laboratory studies on MyChart before the doctors have had a chance to review them. We understand that in some cases there may be results that are confusing or concerning to you. Please understand that not all results are received at the same time and often the doctors may need to interpret multiple results in order to provide you with the best plan of care or course of treatment. Therefore, we ask that you please give us  2 business days to thoroughly review all your results before contacting the office for clarification. Should we see a critical lab result, you will be contacted sooner.   If You Need Anything After Your Visit  If you have any questions or concerns for your doctor, please call our main line at [phone] and press option 4 to reach your doctor's medical assistant. If no one answers, please leave a voicemail as directed and we will return your call as soon as possible. Messages left after 4 pm will be answered the following business day.   You may also send us  a message via MyChart. We typically respond to MyChart messages within 1-2 business days.  For prescription refills, please ask your pharmacy to contact our office. Our fax number is [phone].  If you have an urgent issue when the clinic is closed that cannot wait until the next business day, you can page your doctor at the number below.    Please note that while we do our best to be available for urgent issues outside of office hours, we are not available 24/7.   If you have an urgent issue and are unable to reach  us , you may choose to seek medical care at your doctor's office, retail clinic, urgent care center, or emergency room.  If you have a medical emergency, please immediately call 911 or go to the emergency department.  Pager Numbers  - Dr. Hester: [phone]  - Dr. Jackquline: [phone]  - Dr. Claudene: [phone]   - Dr. Raymund: [phone]  In the event of inclement weather, please call our main line at [phone] for an update on the status of any delays or closures.  Dermatology Medication Tips: Please keep the boxes that topical medications come in in order to help keep track of the instructions about where and how to use these. Pharmacies typically print the medication instructions only on the boxes and not directly on the medication tubes.   If your medication is too expensive, please contact our office at [phone] option 4 or send us  a message through MyChart.   We are unable to tell what your co-pay for medications will be in advance as this is different depending on your insurance coverage. However, we may be able to find a substitute medication at lower cost or fill out paperwork to get insurance to cover a needed medication.   If a prior authorization is required to get your medication covered by your insurance company, please allow us  1-2 business days to complete this process.  Drug prices often vary depending on where the prescription is filled and some pharmacies may offer  cheaper prices.  The website www.goodrx.com contains coupons for medications through different pharmacies. The prices here do not account for what the cost may be with help from insurance (it may be cheaper with your insurance), but the website can give you the price if you did not use any insurance.  - You can print the associated coupon and take it with your prescription to the pharmacy.  - You may also stop by our office during regular business hours and pick up a GoodRx coupon card.  - If  you need your prescription sent electronically to a different pharmacy, notify our office through [facility] or by phone at [phone] option 4.     Si Usted Necesita Algo Despus de Su Visita  Tambin puede enviarnos un mensaje a travs de Clinical cytogeneticist. Por lo general respondemos a los mensajes de MyChart en el transcurso de 1 a 2 das hbiles.  Para renovar recetas, por favor pida a su farmacia que se ponga en contacto con nuestra oficina. Randi lakes de fax es [place] [phone].  Si tiene un asunto urgente cuando la clnica est cerrada y que no puede esperar hasta el siguiente da hbil, puede llamar/localizar a su doctor(a) al nmero que aparece a continuacin.   Por favor, tenga en cuenta que aunque hacemos todo lo posible para estar disponibles para asuntos urgentes fuera del horario de [place], no estamos disponibles las 24 horas del da, los 7 [address]  [address] de la [place].   Si tiene un problema urgente y no puede comunicarse con nosotros, puede optar por buscar atencin mdica  en el consultorio de su doctor(a), en una clnica privada, en un centro de atencin urgente o en una sala de emergencias.  Si tiene Engineer, drilling, por favor llame inmediatamente al 911 o vaya a la sala de emergencias.  Nmeros de bper  - Dr. Hester: [phone]  - Dra. Jackquline: [phone]  - Dr. Claudene: [phone]  - Dra. Kitts: [phone]  En caso de inclemencias del [place], por favor llame a nuestra lnea principal al [phone] para una actualizacin sobre el estado de cualquier retraso o cierre.  Consejos para la medicacin en dermatologa: Por favor, guarde las cajas en las que vienen los medicamentos de uso tpico para ayudarle a seguir las instrucciones sobre dnde y cmo usarlos. Las farmacias generalmente imprimen las instrucciones del medicamento slo en las cajas y no directamente en los tubos del [place].   Si su medicamento es muy caro, por favor, pngase en contacto  con landry rieger llamando al [phone] y presione la opcin 4 o envenos un mensaje a travs de Clinical cytogeneticist.   No podemos decirle cul ser su copago por los medicamentos por adelantado ya que esto es diferente dependiendo de la cobertura de su seguro. Sin embargo, es posible que podamos encontrar un medicamento sustituto a Audiological scientist un formulario para que el seguro cubra el medicamento que se considera necesario.   Si se requiere una autorizacin previa para que su compaa de seguros malta su medicamento, por favor permtanos de 1 a 2 das hbiles para completar este proceso.  Los precios de los medicamentos varan con frecuencia dependiendo del Environmental consultant de dnde se surte la receta y alguna farmacias pueden ofrecer precios ms baratos.  El sitio web www.goodrx.com tiene cupones para medicamentos de Health and safety inspector. Los precios aqu no tienen en cuenta lo que podra costar con la ayuda del seguro (puede ser ms barato con su seguro), pero el sitio web puede darle el  precio si no Visual merchandiser.  - Puede imprimir el cupn correspondiente y llevarlo con su receta a la farmacia.  - Tambin puede pasar por nuestra oficina durante el horario de atencin regular y Education officer, museum una tarjeta de cupones de GoodRx.  - Si necesita que su receta se enve electrnicamente a una farmacia diferente, informe a nuestra oficina a travs de MyChart de [place] o por telfono llamando al [phone] y presione la opcin 4.

## 2024-04-14 NOTE — Progress Notes (Signed)
 Follow-Up Visit   Subjective  Dana Tran is a 73 y.o. female who presents for the following: Skin Cancer Screening and Full Body Skin Exam  The patient presents for Total-Body Skin Exam (TBSE) for skin cancer screening and mole check. The patient has spots, moles and lesions to be evaluated, some may be new or changing. She has a dark spot on her right upper lip, itchy spot on back, spot on the left arm x 2 weeks.   The following portions of the chart were reviewed this encounter and updated as appropriate: medications, allergies, medical history  Review of Systems:  No other skin or systemic complaints except as noted in HPI or Assessment and Plan.  Objective  Well appearing patient in no apparent distress; mood and affect are within normal limits.  A full examination was performed including scalp, head, eyes, ears, nose, lips, neck, chest, axillae, abdomen, back, buttocks, bilateral upper extremities, bilateral lower extremities, hands, feet, fingers, toes, fingernails, and toenails. All findings within normal limits unless otherwise noted below.   Relevant physical exam findings are noted in the Assessment and Plan.  R spinal mid back x 1 Erythematous stuck-on, waxy papule  Assessment & Plan   SKIN CANCER SCREENING PERFORMED TODAY.  ACTINIC DAMAGE - Chronic condition, secondary to cumulative UV/sun exposure - diffuse scaly erythematous macules with underlying dyspigmentation - Recommend daily broad spectrum sunscreen SPF 30+ to sun-exposed areas, reapply every 2 hours as needed.  - Staying in the shade or wearing long sleeves, sun glasses (UVA+UVB protection) and wide brim hats (4-inch brim around the entire circumference of the hat) are also recommended for sun protection.  - Call for new or changing lesions.  LENTIGINES, SEBORRHEIC KERATOSES, HEMANGIOMAS - Benign normal skin lesions - Benign-appearing - Call for any changes  MELANOCYTIC NEVI - Tan-brown and/or  pink-flesh-colored symmetric macules and papules - 4 x 3 mm medium brown macule at right calf - 3 mm medium light brown macule at left upper inner arm - Benign appearing on exam today, stable - Observation - Call clinic for new or changing moles - Recommend daily use of broad spectrum spf 30+ sunscreen to sun-exposed areas.   Milia - tiny firm white papule right forehead - type of cyst - benign - sometimes these will clear with nightly OTC adapalene/Differin 0.1% gel or retinol. - may be extracted if symptomatic - observe  History of Dysplastic Nevus - No evidence of recurrence today - Recommend regular full body skin exams - Recommend daily broad spectrum sunscreen SPF 30+ to sun-exposed areas, reapply every 2 hours as needed.  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 Stable per patient.   Chronic condition with duration or expected duration over one year. Currently well-controlled.  Pt uses clobetasol  ointment as needed (several times since last visit).   Lichen sclerosus is a chronic inflammatory condition of unknown cause that frequently involves the vaginal area and less commonly extragenital skin, and is NOT sexually transmitted. It frequently causes symptoms of pain and burning.  It requires regular monitoring and treatment with topical steroids to minimize inflammation and to reduce risk of scarring. There is also a risk of cancer in the vaginal area which is very low if inflammation is well controlled. Regular checks of the area are recommended. Please call if you notice any new or changing spots within this area.   Treatment Plan: Clobetasol  ointment 0.05 % -  Continue 1 or 2 times weekly as maintenance. For flares- apply thin layer topically to aa vaginal area bid for 2 weeks, then after 2 weeks use once daily to aa for itching/burning.   Topical steroids (such as triamcinolone,  fluocinolone, fluocinonide, mometasone, clobetasol , halobetasol, betamethasone, hydrocortisone) can cause thinning and lightening of the skin if they are used for too long in the same area. Your physician has selected the right strength medicine for your problem and area affected on the body. Please use your medication only as directed by your physician to prevent side effects.   [place] Exam: soft purple papule at right upper lip  Treatment Plan: Benign. Observe   INSECT BITE REACTION VS FOLLICULITIS with purpura Exam: violaceous smooth papule at left elbow, scaly papule left knee  Treatment Plan: Recommend clobetasol  ointment 1 time a day to affected area for up to 2 weeks. Avoid applying to face, groin, and axilla. Use as directed. Long-term use can cause thinning of the skin.  RTC if doesn't clear.  Topical steroids (such as triamcinolone, fluocinolone, fluocinonide, mometasone, clobetasol , halobetasol, betamethasone, hydrocortisone) can cause thinning and lightening of the skin if they are used for too long in the same area. Your physician has selected the right strength medicine for your problem and area affected on the body. Please use your medication only as directed by your physician to prevent side effects.   Recommend DEET containing insect repellant (25% DEET or higher) such as [place] Off or Repel Sportsmen and wear protective clothing when outdoors.   LIPOMA Exam: Subcutaneous rubbery nodule left lateral upper thigh, 2.0 cm  Benign-appearing. Exam most consistent with an Lipoma. Discussed that a Lipoma is a benign fatty growth that can grow over time and sometimes become painful or otherwise symptomatic. Some patients may have one or several lipomas.. Benign Hereditary Lipomatosis is a hereditary familial condition where family members tend to grow multiple lipomas.  Recommend observation if it is not changing, growing or symptomatic. Recommend surgical excision to remove it  if it is painful, growing, symptomatic, or other changes noted. Please contact our office for new or changing lesions so they can be evaluated.  Pt prefers observation at this time  INFLAMED SEBORRHEIC KERATOSIS R spinal mid back x 1 Symptomatic, irritating, patient would like treated. Destruction of lesion - R spinal mid back x 1  Destruction method: cryotherapy   Informed consent: discussed and consent obtained   Lesion destroyed using liquid nitrogen: Yes   Region frozen until ice ball extended beyond lesion: Yes   Outcome: patient tolerated procedure well with no complications   Post-procedure details: wound care instructions given   Additional details:  Prior to procedure, discussed risks of blister formation, small wound, skin dyspigmentation, or rare scar following cryotherapy. Recommend Vaseline ointment to treated areas while healing.   LICHEN SCLEROSUS   Related Medications clobetasol  ointment (TEMOVATE ) 0.05 % Apply thin layer topically to aa bid for 2 weeks. After 2 weeks use once daily to aa for rash. Return in about 1 year (around 04/14/2025) for TBSE, Hx Dysplastic Nevus.  IAndrea Kerns, CMA, am acting as scribe for Rexene Rattler, MD .   Documentation: I have reviewed the above documentation for accuracy and completeness, and I agree with the above.  Rexene Rattler, MD

## 2024-04-23 ENCOUNTER — Other Ambulatory Visit: Payer: Self-pay

## 2024-04-23 DIAGNOSIS — L900 Lichen sclerosus et atrophicus: Secondary | ICD-10-CM

## 2024-04-23 MED ORDER — CLOBETASOL PROPIONATE 0.05 % EX OINT: TOPICAL_OINTMENT | CUTANEOUS | 2 refills | Status: AC

## 2024-04-23 NOTE — Progress Notes (Signed)
 Escripted to [facility] per patient request.

## 2025-04-27 ENCOUNTER — Encounter: Admitting: Dermatology
